# Patient Record
Sex: Female | Born: 1975
Health system: Southern US, Community
[De-identification: ages and names within clinical notes are randomized; demographics above are authoritative.]

## PROBLEM LIST (undated history)

## (undated) DIAGNOSIS — Z9289 Personal history of other medical treatment: Secondary | ICD-10-CM

## (undated) DIAGNOSIS — L309 Dermatitis, unspecified: Secondary | ICD-10-CM

## (undated) DIAGNOSIS — F419 Anxiety disorder, unspecified: Secondary | ICD-10-CM

## (undated) HISTORY — DX: Personal history of other medical treatment: Z92.89

---
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
## 2014-01-06 LAB — HM PAP SMEAR

## 2014-06-17 ENCOUNTER — Encounter: Payer: Self-pay | Admitting: Medical

## 2014-06-17 ENCOUNTER — Ambulatory Visit (INDEPENDENT_AMBULATORY_CARE_PROVIDER_SITE_OTHER): Payer: BC Managed Care – PPO | Admitting: Medical

## 2014-06-17 VITALS — BP 120/80 | HR 76 | Temp 98.1°F | Ht 68.0 in | Wt 154.8 lb

## 2014-06-17 DIAGNOSIS — Z0189 Encounter for other specified special examinations: Secondary | ICD-10-CM

## 2014-06-17 DIAGNOSIS — Z0000 Encounter for general adult medical examination without abnormal findings: Secondary | ICD-10-CM | POA: Insufficient documentation

## 2014-06-17 LAB — CBC WITH DIFFERENTIAL/PLATELET
BASOS PCT: 0.3 % (ref 0.0–3.0)
Basophils Absolute: 0 10*3/uL (ref 0.0–0.1)
EOS PCT: 0.4 % (ref 0.0–5.0)
Eosinophils Absolute: 0 10*3/uL (ref 0.0–0.7)
HEMATOCRIT: 41 % (ref 36.0–46.0)
HEMOGLOBIN: 13.4 g/dL (ref 12.0–15.0)
LYMPHS ABS: 1.3 10*3/uL (ref 0.7–4.0)
Lymphocytes Relative: 16 % (ref 12.0–46.0)
MCHC: 32.7 g/dL (ref 30.0–36.0)
MCV: 89.9 fl (ref 78.0–100.0)
MONO ABS: 0.4 10*3/uL (ref 0.1–1.0)
Monocytes Relative: 5.1 % (ref 3.0–12.0)
NEUTROS ABS: 6.6 10*3/uL (ref 1.4–7.7)
Neutrophils Relative %: 78.2 % — ABNORMAL HIGH (ref 43.0–77.0)
PLATELETS: 241 10*3/uL (ref 150.0–400.0)
RBC: 4.56 Mil/uL (ref 3.87–5.11)
RDW: 12.4 % (ref 11.5–15.5)
WBC: 8.4 10*3/uL (ref 4.0–10.5)

## 2014-06-17 LAB — COMPREHENSIVE METABOLIC PANEL
ALK PHOS: 39 U/L (ref 39–117)
ALT: 9 U/L (ref 0–35)
AST: 13 U/L (ref 0–37)
Albumin: 3.5 g/dL (ref 3.5–5.2)
BILIRUBIN TOTAL: 0.6 mg/dL (ref 0.2–1.2)
BUN: 12 mg/dL (ref 6–23)
CO2: 24 mEq/L (ref 19–32)
CREATININE: 0.7 mg/dL (ref 0.4–1.2)
Calcium: 8.7 mg/dL (ref 8.4–10.5)
Chloride: 104 mEq/L (ref 96–112)
GFR: 102.66 mL/min (ref >60.00)
Glucose, Bld: 84 mg/dL (ref 70–99)
Potassium: 4.7 mEq/L (ref 3.5–5.1)
Sodium: 132 mEq/L — ABNORMAL LOW (ref 135–145)
Total Protein: 6.7 g/dL (ref 6.0–8.3)

## 2014-06-17 LAB — TSH: TSH: 1.14 u[IU]/mL (ref 0.35–4.50)

## 2014-06-17 LAB — LIPID PANEL
CHOLESTEROL: 146 mg/dL (ref 0–200)
HDL: 41.7 mg/dL (ref >39.00)
LDL Cholesterol: 82 mg/dL (ref 0–99)
NonHDL: 104.3
Total CHOL/HDL Ratio: 4
Triglycerides: 113 mg/dL (ref 0.0–149.0)
VLDL: 22.6 mg/dL (ref 0.0–40.0)

## 2014-06-17 NOTE — Assessment & Plan Note (Signed)
Cbc, cmp, tsh, and lipid today. Fill out her form for wellness.

## 2014-06-17 NOTE — Progress Notes (Signed)
Pre visit review using our clinic review tool, if applicable. No additional management support is needed unless otherwise documented below in the visit note. 

## 2014-06-17 NOTE — Progress Notes (Signed)
Subjective:    Patient ID: Lydia Davis, female    DOB: 1975-07-30, 38 y.o.   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 Nonsmoker. 1 glass of wine on weekend approximate.  Pt had flu vaccine in October 2015.  Lmp- December 2nd, 2015.  Pt last pap July 2015 and normal.   No family history of breast cancer.  History of rash on and off etiology unkown. Allergy testing is negative. But better than in the past.   Past Medical History  Diagnosis Date  . History of TB skin testing     postive    History   Social History  . Marital Status: Married    Spouse Name: N/A    Number of Children: N/A  . Years of Education: N/A   Occupational History  . Registered Nurse [employer]    works in the [employer]  . Smoking status: Not on file  . Smokeless tobacco: Not on file  . Alcohol Use: 0.0 oz/week    0 Not specified per week     Comment: occasionally---glass of wine  . Drug Use: No  . Sexual Activity:    Partners: Male   Other Topics Concern  . Not on file   Social History Narrative  . No narrative on file    Past Surgical History  Procedure Laterality Date  . Cesarean section  2012  . Tonsillectomy  1994    Family History  Problem Relation Age of Onset  . Hyperlipidemia Paternal Grandfather   . Heart disease      both sets of grandparents  . Hypertension Father     and both sets of grandparents  . Diabetes Maternal Grandfather     Allergies not on file  No current outpatient prescriptions on file prior to visit.   No current facility-administered medications on file prior to visit.    BP 120/80 mmHg  Pulse 76  Temp(Src) 98.1 F (36.7 C) (Oral)  Wt 154 lb 12.8 oz (70.217 kg)  SpO2 98%  LMP 06/09/2014     Review of Systems  Constitutional: Negative for fever, chills and fatigue.  HENT:  Negative for congestion, ear discharge, ear pain, nosebleeds, postnasal drip, rhinorrhea, sinus pressure, sore throat and trouble swallowing.   Respiratory: Negative for cough, chest tightness, shortness of breath and wheezing.   Cardiovascular: Negative for chest pain and palpitations.  Gastrointestinal: Negative for nausea, vomiting, abdominal pain, diarrhea and constipation.  Genitourinary: Negative for dysuria and flank pain.  Musculoskeletal: Negative for back pain.  Skin: Positive for rash.       Recently improved.  Neurological: Negative for dizziness, tremors, seizures, syncope, weakness, light-headedness, numbness and headaches.  Hematological: Negative for adenopathy. Does not bruise/bleed easily.  Psychiatric/Behavioral: Negative for suicidal ideas, behavioral problems and dysphoric mood. The patient is not nervous/anxious.        Objective:   Physical Exam   General   Mental Status- Alert.  Orientation-Oriented x3. Build and Nutrition Well Nourished and Well Developed.  Skin General: Normal.  Color- Normal color. Moisture- Dry.Temperature warm. Lesions: No suspicious lesions  Head, Eyes, Ears, Nose, Thoat Ears-Normal. Auditory Canal-Bilateral-Normal. Tympanic Membrane- Bilateral-Normal. Eyes Fundi- Bilateral-Normal. Pupil- Bilateral- Direct reaction to light normal. Nose & Sinuses- Normal. Nostril- Bilateral-Normal.  Neck Neck- No Bruits or Masses. Thyroid-  Normal. No thyromegaly or nodules.  Skin- mild scattered faint rash on forearm. Rt posterior hand- mild dry thick rash at mcp joint area.  Chest and Lung Exam  Percussion: Quality and Intensity:-Percussion normal. Percussion of chest reveals- No Dullness. Palpation of the chest reveals- Non-tender. Auscultation: Breath sounds-Normal. Adventitious  Sounds:No adventitious   . Cardiovascular Inspection: No Heaves. Auscultation: Heart Sounds- Normal sinus rhythm without murmur or gallop, S1 WNL and S2  WNL.  Abdomen Inspection:- Inspection Normal. Inspection of abdomen reveals- No Hernias. Palpation/Percussion: Palpation and Percussion of the abdomen reveal- Non Tender and No Palpable masses. Liver: Other Characteristics- No Hepatmegaly Spleen:Other Characteristics- No Splenomegaly. Auscultation: Auscultation of the abdomen reveals-Bowel sounds normal and No Abdominal bruits.      Neurologic Mental Status- Normal Cranial Nerves- Normal Bilaterally, Motor- Normal. Strength:5/5 normal muscle strength- All Muscles. General Assessment of Reflexes- Right Knee- 2+. Left Knee- 2+. Coordination- Normal. Gait- Normal. Meningeal Signs- None.  Musculoskeletal Global Assessment General- Joints show full range of motion without obvious deformity and Normal muscle mass. Strength 5/5 in upper and lower extremities.  Lymphatic General lymphatics Description-No Generalized lymphadenopathy.            Assessment & Plan:

## 2014-06-17 NOTE — Patient Instructions (Addendum)
For your wellness exam, I ordered cbc, cmp, tsh and lipid panel.  I asked pt to find out for sure when last tetanus done. She states 8 yrs but our records indicate she may be due for one. If she needs let us know and we could give.  I filled out form and gave your original. Please make copy up front for our records.  Follow up as needed. Our group would be happy to see you for primary care needs.  Preventive Care for Adults A healthy lifestyle and preventive care can promote health and wellness. Preventive health guidelines for women include the following key practices.  A routine yearly physical is a good way to check with your health care provider about your health and preventive screening. It is a chance to share any concerns and updates on your health and to receive a thorough exam.  Visit your dentist for a routine exam and preventive care every 6 months. Brush your teeth twice a day and floss once a day. Good oral hygiene prevents tooth decay and gum disease.  The frequency of eye exams is based on your age, health, family medical history, use of contact lenses, and other factors. Follow your health care provider's recommendations for frequency of eye exams.  Eat a healthy diet. Foods like vegetables, fruits, whole grains, low-fat dairy products, and lean protein foods contain the nutrients you need without too many calories. Decrease your intake of foods high in solid fats, added sugars, and salt. Eat the right amount of calories for you.Get information about a proper diet from your health care provider, if necessary.  Regular physical exercise is one of the most important things you can do for your health. Most adults should get at least 150 minutes of moderate-intensity exercise (any activity that increases your heart rate and causes you to sweat) each week. In addition, most adults need muscle-strengthening exercises on 2 or more days a week.  Maintain a healthy weight. The body mass  index (BMI) is a screening tool to identify possible weight problems. It provides an estimate of body fat based on height and weight. Your health care provider can find your BMI and can help you achieve or maintain a healthy weight.For adults 20 years and older:  A BMI below 18.5 is considered underweight.  A BMI of 18.5 to 24.9 is normal.  A BMI of 25 to 29.9 is considered overweight.  A BMI of 30 and above is considered obese.  Maintain normal blood lipids and cholesterol levels by exercising and minimizing your intake of saturated fat. Eat a balanced diet with plenty of fruit and vegetables. Blood tests for lipids and cholesterol should begin at age 76 and be repeated every 5 years. If your lipid or cholesterol levels are high, you are over 50, or you are at high risk for heart disease, you may need your cholesterol levels checked more frequently.Ongoing high lipid and cholesterol levels should be treated with medicines if diet and exercise are not working.  If you smoke, find out from your health care provider how to quit. If you do not use tobacco, do not start.  Lung cancer screening is recommended for adults aged 48-80 years who are at high risk for developing lung cancer because of a history of smoking. A yearly low-dose CT scan of the lungs is recommended for people who have at least a 30-pack-year history of smoking and are a current smoker or have quit within the past 15 years. A  pack year of smoking is smoking an average of 1 pack of cigarettes a day for 1 year (for example: 1 pack a day for 30 years or 2 packs a day for 15 years). Yearly screening should continue until the smoker has stopped smoking for at least 15 years. Yearly screening should be stopped for people who develop a health problem that would prevent them from having lung cancer treatment.  If you are pregnant, do not drink alcohol. If you are breastfeeding, be very cautious about drinking alcohol. If you are not pregnant  and choose to drink alcohol, do not have more than 1 drink per day. One drink is considered to be 12 ounces (355 mL) of beer, 5 ounces (148 mL) of wine, or 1.5 ounces (44 mL) of liquor.  Avoid use of street drugs. Do not share needles with anyone. Ask for help if you need support or instructions about stopping the use of drugs.  High blood pressure causes heart disease and increases the risk of stroke. Your blood pressure should be checked at least every 1 to 2 years. Ongoing high blood pressure should be treated with medicines if weight loss and exercise do not work.  If you are 12-69 years old, ask your health care provider if you should take aspirin to prevent strokes.  Diabetes screening involves taking a blood sample to check your fasting blood sugar level. This should be done once every 3 years, after age 87, if you are within normal weight and without risk factors for diabetes. Testing should be considered at a younger age or be carried out more frequently if you are overweight and have at least 1 risk factor for diabetes.  Breast cancer screening is essential preventive care for women. You should practice "breast self-awareness." This means understanding the normal appearance and feel of your breasts and may include breast self-examination. Any changes detected, no matter how small, should be reported to a health care provider. Women in their 9s and 30s should have a clinical breast exam (CBE) by a health care provider as part of a regular health exam every 1 to 3 years. After age 36, women should have a CBE every year. Starting at age 33, women should consider having a mammogram (breast X-ray test) every year. Women who have a family history of breast cancer should talk to their health care provider about genetic screening. Women at a high risk of breast cancer should talk to their health care providers about having an MRI and a mammogram every year.  Breast cancer gene (BRCA)-related cancer  risk assessment is recommended for women who have family members with BRCA-related cancers. BRCA-related cancers include breast, ovarian, tubal, and peritoneal cancers. Having family members with these cancers may be associated with an increased risk for harmful changes (mutations) in the breast cancer genes BRCA1 and BRCA2. Results of the assessment will determine the need for genetic counseling and BRCA1 and BRCA2 testing.  Routine pelvic exams to screen for cancer are no longer recommended for nonpregnant women who are considered low risk for cancer of the pelvic organs (ovaries, uterus, and vagina) and who do not have symptoms. Ask your health care provider if a screening pelvic exam is right for you.  If you have had past treatment for cervical cancer or a condition that could lead to cancer, you need Pap tests and screening for cancer for at least 20 years after your treatment. If Pap tests have been discontinued, your risk factors (such as having  a new sexual partner) need to be reassessed to determine if screening should be resumed. Some women have medical problems that increase the chance of getting cervical cancer. In these cases, your health care provider may recommend more frequent screening and Pap tests.  The HPV test is an additional test that may be used for cervical cancer screening. The HPV test looks for the virus that can cause the cell changes on the cervix. The cells collected during the Pap test can be tested for HPV. The HPV test could be used to screen women aged 26 years and older, and should be used in women of any age who have unclear Pap test results. After the age of 55, women should have HPV testing at the same frequency as a Pap test.  Colorectal cancer can be detected and often prevented. Most routine colorectal cancer screening begins at the age of 63 years and continues through age 27 years. However, your health care provider may recommend screening at an earlier age if you  have risk factors for colon cancer. On a yearly basis, your health care provider may provide home test kits to check for hidden blood in the stool. Use of a small camera at the end of a tube, to directly examine the colon (sigmoidoscopy or colonoscopy), can detect the earliest forms of colorectal cancer. Talk to your health care provider about this at age 17, when routine screening begins. Direct exam of the colon should be repeated every 5-10 years through age 55 years, unless early forms of pre-cancerous polyps or small growths are found.  People who are at an increased risk for hepatitis B should be screened for this virus. You are considered at high risk for hepatitis B if:  You were born in a country where hepatitis B occurs often. Talk with your health care provider about which countries are considered high risk.  Your parents were born in a high-risk country and you have not received a shot to protect against hepatitis B (hepatitis B vaccine).  You have HIV or AIDS.  You use needles to inject street drugs.  You live with, or have sex with, someone who has hepatitis B.  You get hemodialysis treatment.  You take certain medicines for conditions like cancer, organ transplantation, and autoimmune conditions.  Hepatitis C blood testing is recommended for all people born from [birth year] through 1965 and any individual with known risks for hepatitis C.  Practice safe sex. Use condoms and avoid high-risk sexual practices to reduce the spread of sexually transmitted infections (STIs). STIs include gonorrhea, chlamydia, syphilis, trichomonas, herpes, HPV, and human immunodeficiency virus (HIV). Herpes, HIV, and HPV are viral illnesses that have no cure. They can result in disability, cancer, and death.  You should be screened for sexually transmitted illnesses (STIs) including gonorrhea and chlamydia if:  You are sexually active and are younger than 24 years.  You are older than 24 years and your  health care provider tells you that you are at risk for this type of infection.  Your sexual activity has changed since you were last screened and you are at an increased risk for chlamydia or gonorrhea. Ask your health care provider if you are at risk.  If you are at risk of being infected with HIV, it is recommended that you take a prescription medicine daily to prevent HIV infection. This is called preexposure prophylaxis (PrEP). You are considered at risk if:  You are a heterosexual woman, are sexually active, and are  at increased risk for HIV infection.  You take drugs by injection.  You are sexually active with a partner who has HIV.  Talk with your health care provider about whether you are at high risk of being infected with HIV. If you choose to begin PrEP, you should first be tested for HIV. You should then be tested every 3 months for as long as you are taking PrEP.  Osteoporosis is a disease in which the bones lose minerals and strength with aging. This can result in serious bone fractures or breaks. The risk of osteoporosis can be identified using a bone density scan. Women ages 104 years and over and women at risk for fractures or osteoporosis should discuss screening with their health care providers. Ask your health care provider whether you should take a calcium supplement or vitamin D to reduce the rate of osteoporosis.  Menopause can be associated with physical symptoms and risks. Hormone replacement therapy is available to decrease symptoms and risks. You should talk to your health care provider about whether hormone replacement therapy is right for you.  Use sunscreen. Apply sunscreen liberally and repeatedly throughout the day. You should seek shade when your shadow is shorter than you. Protect yourself by wearing long sleeves, pants, a wide-brimmed hat, and sunglasses year round, whenever you are outdoors.  Once a month, do a whole body skin exam, using a mirror to look at  the skin on your back. Tell your health care provider of new moles, moles that have irregular borders, moles that are larger than a pencil eraser, or moles that have changed in shape or color.  Stay current with required vaccines (immunizations).  Influenza vaccine. All adults should be immunized every year.  Tetanus, diphtheria, and acellular pertussis (Td, Tdap) vaccine. Pregnant women should receive 1 dose of Tdap vaccine during each pregnancy. The dose should be obtained regardless of the length of time since the last dose. Immunization is preferred during the 27th-36th week of gestation. An adult who has not previously received Tdap or who does not know her vaccine status should receive 1 dose of Tdap. This initial dose should be followed by tetanus and diphtheria toxoids (Td) booster doses every 10 years. Adults with an unknown or incomplete history of completing a 3-dose immunization series with Td-containing vaccines should begin or complete a primary immunization series including a Tdap dose. Adults should receive a Td booster every 10 years.  Varicella vaccine. An adult without evidence of immunity to varicella should receive 2 doses or a second dose if she has previously received 1 dose. Pregnant females who do not have evidence of immunity should receive the first dose after pregnancy. This first dose should be obtained before leaving the health care facility. The second dose should be obtained 4-8 weeks after the first dose.  Human papillomavirus (HPV) vaccine. Females aged 13-26 years who have not received the vaccine previously should obtain the 3-dose series. The vaccine is not recommended for use in pregnant females. However, pregnancy testing is not needed before receiving a dose. If a female is found to be pregnant after receiving a dose, no treatment is needed. In that case, the remaining doses should be delayed until after the pregnancy. Immunization is recommended for any person with  an immunocompromised condition through the age of 66 years if she did not get any or all doses earlier. During the 3-dose series, the second dose should be obtained 4-8 weeks after the first dose. The third dose should  be obtained 24 weeks after the first dose and 16 weeks after the second dose.  Zoster vaccine. One dose is recommended for adults aged 39 years or older unless certain conditions are present.  Measles, mumps, and rubella (MMR) vaccine. Adults born before [birth year] generally are considered immune to measles and mumps. Adults born in [birth year] or later should have 1 or more doses of MMR vaccine unless there is a contraindication to the vaccine or there is laboratory evidence of immunity to each of the three diseases. A routine second dose of MMR vaccine should be obtained at least 28 days after the first dose for students attending postsecondary schools, health care workers, or international travelers. People who received inactivated measles vaccine or an unknown type of measles vaccine during 1963-1967 should receive 2 doses of MMR vaccine. People who received inactivated mumps vaccine or an unknown type of mumps vaccine before 1979 and are at high risk for mumps infection should consider immunization with 2 doses of MMR vaccine. For females of childbearing age, rubella immunity should be determined. If there is no evidence of immunity, females who are not pregnant should be vaccinated. If there is no evidence of immunity, females who are pregnant should delay immunization until after pregnancy. Unvaccinated health care workers born before [birth year] who lack laboratory evidence of measles, mumps, or rubella immunity or laboratory confirmation of disease should consider measles and mumps immunization with 2 doses of MMR vaccine or rubella immunization with 1 dose of MMR vaccine.  Pneumococcal 13-valent conjugate (PCV13) vaccine. When indicated, a person who is uncertain of her immunization history and has no  record of immunization should receive the PCV13 vaccine. An adult aged 62 years or older who has certain medical conditions and has not been previously immunized should receive 1 dose of PCV13 vaccine. This PCV13 should be followed with a dose of pneumococcal polysaccharide (PPSV23) vaccine. The PPSV23 vaccine dose should be obtained at least 8 weeks after the dose of PCV13 vaccine. An adult aged 33 years or older who has certain medical conditions and previously received 1 or more doses of PPSV23 vaccine should receive 1 dose of PCV13. The PCV13 vaccine dose should be obtained 1 or more years after the last PPSV23 vaccine dose.  Pneumococcal polysaccharide (PPSV23) vaccine. When PCV13 is also indicated, PCV13 should be obtained first. All adults aged 24 years and older should be immunized. An adult younger than age 63 years who has certain medical conditions should be immunized. Any person who resides in a nursing home or long-term care facility should be immunized. An adult smoker should be immunized. People with an immunocompromised condition and certain other conditions should receive both PCV13 and PPSV23 vaccines. People with human immunodeficiency virus (HIV) infection should be immunized as soon as possible after diagnosis. Immunization during chemotherapy or radiation therapy should be avoided. Routine use of PPSV23 vaccine is not recommended for American Indians, [place] Natives, or people younger than 65 years unless there are medical conditions that require PPSV23 vaccine. When indicated, people who have unknown immunization and have no record of immunization should receive PPSV23 vaccine. One-time revaccination 5 years after the first dose of PPSV23 is recommended for people aged 19-64 years who have chronic kidney failure, nephrotic syndrome, asplenia, or immunocompromised conditions. People who received 1-2 doses of PPSV23 before age 60 years should receive another dose of PPSV23 vaccine at age 3  years or later if at least 5 years have passed since the previous dose. Doses of PPSV23  are not needed for people immunized with PPSV23 at or after age 61 years.  Meningococcal vaccine. Adults with asplenia or persistent complement component deficiencies should receive 2 doses of quadrivalent meningococcal conjugate (MenACWY-D) vaccine. The doses should be obtained at least 2 months apart. Microbiologists working with certain meningococcal bacteria, [place] recruits, people at risk during an outbreak, and people who travel to or live in countries with a high rate of meningitis should be immunized. A first-year college student up through age 83 years who is living in a residence hall should receive a dose if she did not receive a dose on or after her 16th birthday. Adults who have certain high-risk conditions should receive one or more doses of vaccine.  Hepatitis A vaccine. Adults who wish to be protected from this disease, have certain high-risk conditions, work with hepatitis A-infected animals, work in hepatitis A research labs, or travel to or work in countries with a high rate of hepatitis A should be immunized. Adults who were previously unvaccinated and who anticipate close contact with an international adoptee during the first 60 days after arrival in the [place] from a country with a high rate of hepatitis A should be immunized.  Hepatitis B vaccine. Adults who wish to be protected from this disease, have certain high-risk conditions, may be exposed to blood or other infectious body fluids, are household contacts or sex partners of hepatitis B positive people, are clients or workers in certain care facilities, or travel to or work in countries with a high rate of hepatitis B should be immunized.  Haemophilus influenzae type b (Hib) vaccine. A previously unvaccinated person with asplenia or sickle cell disease or having a scheduled splenectomy should receive 1 dose of Hib vaccine. Regardless  of previous immunization, a recipient of a hematopoietic stem cell transplant should receive a 3-dose series 6-12 months after her successful transplant. Hib vaccine is not recommended for adults with HIV infection. Preventive Services / Frequency Ages 62 to 2 years  Blood pressure check.** / Every 1 to 2 years.  Lipid and cholesterol check.** / Every 5 years beginning at age 64.  Clinical breast exam.** / Every 3 years for women in their 36s and 45s.  BRCA-related cancer risk assessment.** / For women who have family members with a BRCA-related cancer (breast, ovarian, tubal, or peritoneal cancers).  Pap test.** / Every 2 years from ages 56 through 65. Every 3 years starting at age 25 through age 23 or 49 with a history of 3 consecutive normal Pap tests.  HPV screening.** / Every 3 years from ages 77 through ages 68 to 41 with a history of 3 consecutive normal Pap tests.  Hepatitis C blood test.** / For any individual with known risks for hepatitis C.  Skin self-exam. / Monthly.  Influenza vaccine. / Every year.  Tetanus, diphtheria, and acellular pertussis (Tdap, Td) vaccine.** / Consult your health care provider. Pregnant women should receive 1 dose of Tdap vaccine during each pregnancy. 1 dose of Td every 10 years.  Varicella vaccine.** / Consult your health care provider. Pregnant females who do not have evidence of immunity should receive the first dose after pregnancy.  HPV vaccine. / 3 doses over 6 months, if 56 and younger. The vaccine is not recommended for use in pregnant females. However, pregnancy testing is not needed before receiving a dose.  Measles, mumps, rubella (MMR) vaccine.** / You need at least 1 dose of MMR if you were born in 1957 or later.  You may also need a 2nd dose. For females of childbearing age, rubella immunity should be determined. If there is no evidence of immunity, females who are not pregnant should be vaccinated. If there is no evidence of immunity,  females who are pregnant should delay immunization until after pregnancy.  Pneumococcal 13-valent conjugate (PCV13) vaccine.** / Consult your health care provider.  Pneumococcal polysaccharide (PPSV23) vaccine.** / 1 to 2 doses if you smoke cigarettes or if you have certain conditions.  Meningococcal vaccine.** / 1 dose if you are age 46 to 47 years and a [employer] researcher living in a residence hall, or have one of several medical conditions, you need to get vaccinated against meningococcal disease. You may also need additional booster doses.  Hepatitis A vaccine.** / Consult your health care provider.  Hepatitis B vaccine.** / Consult your health care provider.  Haemophilus influenzae type b (Hib) vaccine.** / Consult your health care provider. Ages 51 to 70 years  Blood pressure check.** / Every 1 to 2 years.  Lipid and cholesterol check.** / Every 5 years beginning at age 71 years.  Lung cancer screening. / Every year if you are aged 76-80 years and have a 30-pack-year history of smoking and currently smoke or have quit within the past 15 years. Yearly screening is stopped once you have quit smoking for at least 15 years or develop a health problem that would prevent you from having lung cancer treatment.  Clinical breast exam.** / Every year after age 59 years.  BRCA-related cancer risk assessment.** / For women who have family members with a BRCA-related cancer (breast, ovarian, tubal, or peritoneal cancers).  Mammogram.** / Every year beginning at age 5 years and continuing for as long as you are in good health. Consult with your health care provider.  Pap test.** / Every 3 years starting at age 63 years through age 2 or 29 years with a history of 3 consecutive normal Pap tests.  HPV screening.** / Every 3 years from ages 68 years through ages 80 to 59 years with a history of 3 consecutive normal Pap tests.  Fecal occult blood test (FOBT) of stool. / Every year  beginning at age 28 years and continuing until age 80 years. You may not need to do this test if you get a colonoscopy every 10 years.  Flexible sigmoidoscopy or colonoscopy.** / Every 5 years for a flexible sigmoidoscopy or every 10 years for a colonoscopy beginning at age 17 years and continuing until age 18 years.  Hepatitis C blood test.** / For all people born from [birth year] through 1965 and any individual with known risks for hepatitis C.  Skin self-exam. / Monthly.  Influenza vaccine. / Every year.  Tetanus, diphtheria, and acellular pertussis (Tdap/Td) vaccine.** / Consult your health care provider. Pregnant women should receive 1 dose of Tdap vaccine during each pregnancy. 1 dose of Td every 10 years.  Varicella vaccine.** / Consult your health care provider. Pregnant females who do not have evidence of immunity should receive the first dose after pregnancy.  Zoster vaccine.** / 1 dose for adults aged 27 years or older.  Measles, mumps, rubella (MMR) vaccine.** / You need at least 1 dose of MMR if you were born in 1957 or later. You may also need a 2nd dose. For females of childbearing age, rubella immunity should be determined. If there is no evidence of immunity, females who are not pregnant should be vaccinated. If there is no evidence of immunity, females  who are pregnant should delay immunization until after pregnancy.  Pneumococcal 13-valent conjugate (PCV13) vaccine.** / Consult your health care provider.  Pneumococcal polysaccharide (PPSV23) vaccine.** / 1 to 2 doses if you smoke cigarettes or if you have certain conditions.  Meningococcal vaccine.** / Consult your health care provider.  Hepatitis A vaccine.** / Consult your health care provider.  Hepatitis B vaccine.** / Consult your health care provider.  Haemophilus influenzae type b (Hib) vaccine.** / Consult your health care provider. Ages 51 years and over  Blood pressure check.** / Every 1 to 2 years.  Lipid and  cholesterol check.** / Every 5 years beginning at age 7 years.  Lung cancer screening. / Every year if you are aged 53-80 years and have a 30-pack-year history of smoking and currently smoke or have quit within the past 15 years. Yearly screening is stopped once you have quit smoking for at least 15 years or develop a health problem that would prevent you from having lung cancer treatment.  Clinical breast exam.** / Every year after age 62 years.  BRCA-related cancer risk assessment.** / For women who have family members with a BRCA-related cancer (breast, ovarian, tubal, or peritoneal cancers).  Mammogram.** / Every year beginning at age 23 years and continuing for as long as you are in good health. Consult with your health care provider.  Pap test.** / Every 3 years starting at age 34 years through age 71 or 82 years with 3 consecutive normal Pap tests. Testing can be stopped between 65 and 70 years with 3 consecutive normal Pap tests and no abnormal Pap or HPV tests in the past 10 years.  HPV screening.** / Every 3 years from ages 28 years through ages 25 or 65 years with a history of 3 consecutive normal Pap tests. Testing can be stopped between 65 and 70 years with 3 consecutive normal Pap tests and no abnormal Pap or HPV tests in the past 10 years.  Fecal occult blood test (FOBT) of stool. / Every year beginning at age 53 years and continuing until age 77 years. You may not need to do this test if you get a colonoscopy every 10 years.  Flexible sigmoidoscopy or colonoscopy.** / Every 5 years for a flexible sigmoidoscopy or every 10 years for a colonoscopy beginning at age 40 years and continuing until age 36 years.  Hepatitis C blood test.** / For all people born from [birth year] through 1965 and any individual with known risks for hepatitis C.  Osteoporosis screening.** / A one-time screening for women ages 64 years and over and women at risk for fractures or osteoporosis.  Skin self-exam. /  Monthly.  Influenza vaccine. / Every year.  Tetanus, diphtheria, and acellular pertussis (Tdap/Td) vaccine.** / 1 dose of Td every 10 years.  Varicella vaccine.** / Consult your health care provider.  Zoster vaccine.** / 1 dose for adults aged 49 years or older.  Pneumococcal 13-valent conjugate (PCV13) vaccine.** / Consult your health care provider.  Pneumococcal polysaccharide (PPSV23) vaccine.** / 1 dose for all adults aged 79 years and older.  Meningococcal vaccine.** / Consult your health care provider.  Hepatitis A vaccine.** / Consult your health care provider.  Hepatitis B vaccine.** / Consult your health care provider.  Haemophilus influenzae type b (Hib) vaccine.** / Consult your health care provider. ** Family history and personal history of risk and conditions may change your health care provider's recommendations. Document Released: 08/21/2001 Document Revised: 11/09/2013 Document Reviewed: 11/20/2010 [employer] Patient Information 2015  [employer]. This information is not intended to replace advice given to you by your health care provider. Make sure you discuss any questions you have with your health care provider.

## 2015-01-04 ENCOUNTER — Telehealth: Payer: Self-pay | Admitting: Medical

## 2015-01-04 NOTE — Telephone Encounter (Signed)
Fine with me

## 2015-01-04 NOTE — Telephone Encounter (Signed)
No problem.

## 2015-01-04 NOTE — Telephone Encounter (Signed)
Pt would to transfer care from [facility]. Dr. Etter Sjogren please advise

## 2015-01-31 ENCOUNTER — Telehealth: Payer: Self-pay | Admitting: Behavioral Health

## 2015-01-31 NOTE — Telephone Encounter (Signed)
Unable to reach patient at time of Pre-Visit Call.  Left message for patient to return call when available.    

## 2015-02-01 ENCOUNTER — Ambulatory Visit (INDEPENDENT_AMBULATORY_CARE_PROVIDER_SITE_OTHER): Payer: BLUE CROSS/BLUE SHIELD | Admitting: Family Medicine

## 2015-02-01 ENCOUNTER — Encounter: Payer: Self-pay | Admitting: Family Medicine

## 2015-02-01 VITALS — BP 110/74 | HR 83 | Temp 98.4°F | Ht 68.0 in | Wt 151.4 lb

## 2015-02-01 DIAGNOSIS — E049 Nontoxic goiter, unspecified: Secondary | ICD-10-CM | POA: Diagnosis not present

## 2015-02-01 NOTE — Patient Instructions (Signed)
Goiter Goiter is an enlarged thyroid gland. The thyroid gland sits at the base of the front of the neck. The gland produces hormones that regulate mood, body temperature, pulse rate, and digestion. Most goiters are painless and are not a cause for serious concern. Goiters and conditions that cause goiters can be treated if necessary.  CAUSES  Common causes of goiter include:  Graves disease (causes too much hormone to be produced [hyperthyroidism]).  Hashimoto disease (causes too little hormone to be produced [hypothyroidism]).  Thyroiditis (inflammation of the thyroid sometimes caused by virus or pregnancy).  Nodular goiter (small bumps form; sometimes called toxic nodular goiter).  Pregnancy.  Thyroid cancer (very few goiters with nodules are cancerous).  Certain medications.  Radiation exposure.  Iodine deficiency (more common in developing countries in inland populations). RISK FACTORS Risk factors for goiter include:  A family history of goiter.  Female gender.  Inadequate iodine in the diet.  Age older than 25 years. SYMPTOMS  Many goiters do not cause symptoms. When symptoms do occur, they may include:  Swelling in the lower part of the neck. This swelling can range from a very small bump to a large lump.  A tight feeling in the throat.  A hoarse voice. Less commonly, a goiter may result in:  Coughing.  Wheezing.  Difficulty swallowing.  Difficulty breathing.  Bulging neck veins.  Dizziness. When a goiter is the result of hyperthyroidism, symptoms may include:  Rapid or irregular heartbeat.  Sickness in your stomach (nausea).  Vomiting.  Diarrhea.  Shaking.  Irritable feeling.  Bulging eyes.  Weight loss.  Heat sensitivity.  Anxiety. When a goiter is the result of hypothyroidism, symptoms may include:  Tiredness.  Dry skin.  Constipation.  Weight gain.  Irregular menstrual cycle.  Depressed mood.  Sensitivity to  cold. DIAGNOSIS  Tests used to diagnose goiter include:  A physical exam.  Blood tests, including thyroid hormone levels and antibody testing.  Ultrasonography, computerized X-ray scan (computed tomography, CT) or computerized magnetic scan (magnetic resonance imaging, MRI).  Thyroid scan (imaging along with safe radioactive injection).  Tissue sample taken (biopsy) of nodules. This is sometimes done to confirm that the nodules are not cancerous. TREATMENT  Treatment will depend on the cause of the goiter. Treatment may include:  Monitoring. In some cases, no treatment is necessary, and your doctor will monitor your condition at regular checkups.  Medications and supplements. Thyroid medication (thyroid hormone replacement) is available for hyperthyroidism and hypothyroidism.  If inflammation is the cause, over-the-counter medication or steroid medication may be recommended.  Goiters caused by iodine deficiency can be treated with iodine supplements or changes in diet.  Radioactive iodine treatment. Radioactive iodine is injected into the blood. It travels to the thyroid gland, kills thyroid cells, and reduces the size of the gland. This is only used when the thyroid gland is overactive. Lifelong thyroid hormone medication is often necessary after this treatment.  Surgery. A procedure to remove all or part of the gland may be recommended in severe cases or when cancer is the cause. Hormones can be taken to replace the hormones normally produced by the thyroid. HOME CARE INSTRUCTIONS   Take medications as directed.  Follow your caregiver's recommendations for any dietary changes.  Follow up with your caregiver for further examination and testing, as directed. PREVENTION   If you have a family history of goiter, discuss screening with your doctor.  Make sure you are getting enough iodine in your diet.  Use  of iodized table salt can help prevent iodine deficiency. Document  Released: 12/13/2009 Document Revised: 11/09/2013 Document Reviewed: 12/13/2009 [facility] Patient Information 2015 [place], [place]. This information is not intended to replace advice given to you by your health care provider. Make sure you discuss any questions you have with your health care provider.

## 2015-02-01 NOTE — Progress Notes (Signed)
Subjective:    Patient ID: Lydia Davis, female    DOB: Nov 28, 1975, 39 y.o.   MRN: [id number]  HPI  Patient here to discuss her dentist appointment.  Her dentist told her her thyroid was enlarged.  She has no other thyroid symptoms except dry hair.    Past Medical History  Diagnosis Date  . History of TB skin testing     postive    Review of Systems  Constitutional: Negative for diaphoresis, appetite change, fatigue and unexpected weight change.  HENT: Negative for congestion and trouble swallowing.   Eyes: Negative for pain, redness and visual disturbance.  Respiratory: Negative for cough, chest tightness, shortness of breath and wheezing.   Cardiovascular: Negative for chest pain, palpitations and leg swelling.  Gastrointestinal: Negative for nausea, vomiting, diarrhea and constipation.  Endocrine: Negative for cold intolerance, heat intolerance, polydipsia, polyphagia and polyuria.  Genitourinary: Negative for dysuria, frequency and difficulty urinating.  Neurological: Negative for dizziness, light-headedness, numbness and headaches.  Psychiatric/Behavioral: Negative for dysphoric mood and decreased concentration. The patient is not nervous/anxious.     Current Outpatient Prescriptions on File Prior to Visit  Medication Sig Dispense Refill  . LOMEDIA 24 FE 1-20 MG-MCG(24) tablet   10   No current facility-administered medications on file prior to visit.       Objective:    Physical Exam  Constitutional: She is oriented to person, place, and time. She appears well-developed and well-nourished.  HENT:  Head: Normocephalic and atraumatic.  Eyes: Conjunctivae and EOM are normal.  Neck: Normal range of motion. Neck supple. No JVD present. Carotid bruit is not present. Thyromegaly present. No thyroid mass present.  Cardiovascular: Normal rate, regular rhythm and normal heart sounds.   No murmur heard. Pulmonary/Chest: Effort normal and breath sounds normal. No respiratory  distress. She has no wheezes. She has no rales. She exhibits no tenderness.  Musculoskeletal: She exhibits no edema.  Neurological: She is alert and oriented to person, place, and time.  Psychiatric: She has a normal mood and affect.    BP 110/74 mmHg  Pulse 83  Temp(Src) 98.4 F (36.9 C) (Oral)  Ht 5\' 8"  (1.727 m)  Wt 151 lb 6.4 oz (68.675 kg)  BMI 23.03 kg/m2  SpO2 99%  LMP 01/14/2015 (Exact Date) Wt Readings from Last 3 Encounters:  02/01/15 151 lb 6.4 oz (68.675 kg)  06/17/14 154 lb 12.8 oz (70.217 kg)     Lab Results  Component Value Date   WBC 8.4 06/17/2014   HGB 13.4 06/17/2014   HCT 41.0 06/17/2014   PLT 241.0 06/17/2014   GLUCOSE 84 06/17/2014   CHOL 146 06/17/2014   TRIG 113.0 06/17/2014   HDL 41.70 06/17/2014   LDLCALC 82 06/17/2014   ALT 9 06/17/2014   AST 13 06/17/2014   NA 132* 06/17/2014   K 4.7 06/17/2014   CL 104 06/17/2014   CREATININE 0.7 06/17/2014   BUN 12 06/17/2014   CO2 24 06/17/2014   TSH 1.14 06/17/2014       Assessment & Plan:   Problem List Items Addressed This Visit    None    Visit Diagnoses    Goiter    -  Primary    Relevant Orders    Thyroid Panel With TSH    US Soft Tissue Head/Neck       I am having Ms. Tutterow maintain her LOMEDIA 24 FE.  No orders of the defined types were placed in this encounter.  Garnet Koyanagi, DO

## 2015-02-01 NOTE — Progress Notes (Signed)
Pre visit review using our clinic review tool, if applicable. No additional management support is needed unless otherwise documented below in the visit note. 

## 2015-02-02 LAB — THYROID PANEL WITH TSH
FREE THYROXINE INDEX: 2.6 (ref 1.4–3.8)
T3 Uptake: 24 % (ref 22–35)
T4, Total: 10.9 ug/dL (ref 4.5–12.0)
TSH: 2.274 u[IU]/mL (ref 0.350–4.500)

## 2015-02-03 ENCOUNTER — Ambulatory Visit (HOSPITAL_BASED_OUTPATIENT_CLINIC_OR_DEPARTMENT_OTHER)
Admission: RE | Admit: 2015-02-03 | Discharge: 2015-02-03 | Disposition: A | Discharge status: Home / Self Care | Payer: BLUE CROSS/BLUE SHIELD | Source: Ambulatory Visit | Attending: Family Medicine | Admitting: Family Medicine

## 2015-02-03 DIAGNOSIS — E049 Nontoxic goiter, unspecified: Secondary | ICD-10-CM

## 2015-02-03 DIAGNOSIS — E010 Iodine-deficiency related diffuse (endemic) goiter: Secondary | ICD-10-CM | POA: Diagnosis not present

## 2015-02-09 DIAGNOSIS — L209 Atopic dermatitis, unspecified: Secondary | ICD-10-CM | POA: Insufficient documentation

## 2015-06-20 ENCOUNTER — Encounter: Payer: BLUE CROSS/BLUE SHIELD | Admitting: Medical

## 2015-10-24 ENCOUNTER — Ambulatory Visit (INDEPENDENT_AMBULATORY_CARE_PROVIDER_SITE_OTHER): Payer: BLUE CROSS/BLUE SHIELD | Admitting: Family Medicine

## 2015-10-24 ENCOUNTER — Encounter: Payer: Self-pay | Admitting: Family Medicine

## 2015-10-24 VITALS — BP 117/80 | HR 74 | Temp 98.1°F | Ht 68.0 in | Wt 156.4 lb

## 2015-10-24 DIAGNOSIS — Z0000 Encounter for general adult medical examination without abnormal findings: Secondary | ICD-10-CM

## 2015-10-24 DIAGNOSIS — Z309 Encounter for contraceptive management, unspecified: Secondary | ICD-10-CM

## 2015-10-24 LAB — POCT URINALYSIS DIPSTICK
BILIRUBIN UA: NEGATIVE
Blood, UA: NEGATIVE
GLUCOSE UA: NEGATIVE
KETONES UA: NEGATIVE
LEUKOCYTES UA: NEGATIVE
Nitrite, UA: NEGATIVE
PROTEIN UA: NEGATIVE
Urobilinogen, UA: 0.2
pH, UA: 6

## 2015-10-24 LAB — CBC WITH DIFFERENTIAL/PLATELET
BASOS ABS: 0 10*3/uL (ref 0.0–0.1)
Basophils Relative: 0.4 % (ref 0.0–3.0)
EOS PCT: 0.6 % (ref 0.0–5.0)
Eosinophils Absolute: 0 10*3/uL (ref 0.0–0.7)
HCT: 41.1 % (ref 36.0–46.0)
HEMOGLOBIN: 14 g/dL (ref 12.0–15.0)
LYMPHS ABS: 1.3 10*3/uL (ref 0.7–4.0)
Lymphocytes Relative: 27.2 % (ref 12.0–46.0)
MCHC: 34 g/dL (ref 30.0–36.0)
MCV: 87.7 fl (ref 78.0–100.0)
MONO ABS: 0.3 10*3/uL (ref 0.1–1.0)
MONOS PCT: 6.2 % (ref 3.0–12.0)
NEUTROS PCT: 65.6 % (ref 43.0–77.0)
Neutro Abs: 3.2 10*3/uL (ref 1.4–7.7)
Platelets: 272 10*3/uL (ref 150.0–400.0)
RBC: 4.68 Mil/uL (ref 3.87–5.11)
RDW: 12 % (ref 11.5–15.5)
WBC: 4.9 10*3/uL (ref 4.0–10.5)

## 2015-10-24 LAB — TSH: TSH: 1.15 u[IU]/mL (ref 0.35–4.50)

## 2015-10-24 LAB — LIPID PANEL
CHOLESTEROL: 176 mg/dL (ref 0–200)
HDL: 45.3 mg/dL (ref >39.00)
LDL CALC: 111 mg/dL — AB (ref 0–99)
NonHDL: 130.6
Total CHOL/HDL Ratio: 4
Triglycerides: 100 mg/dL (ref 0.0–149.0)
VLDL: 20 mg/dL (ref 0.0–40.0)

## 2015-10-24 LAB — VITAMIN B12: Vitamin B-12: 190 pg/mL — ABNORMAL LOW (ref 211–911)

## 2015-10-24 MED ORDER — NORETHIN ACE-ETH ESTRAD-FE 1-20 MG-MCG(24) PO TABS: 1.0000 | ORAL_TABLET | Freq: Every day | ORAL | Status: DC

## 2015-10-24 NOTE — Patient Instructions (Addendum)
Preventive Care for Adults, Female A healthy lifestyle and preventive care can promote health and wellness. Preventive health guidelines for women include the following key practices.  A routine yearly physical is a good way to check with your health care provider about your health and preventive screening. It is a chance to share any concerns and updates on your health and to receive a thorough exam.  Visit your dentist for a routine exam and preventive care every 6 months. Brush your teeth twice a day and floss once a day. Good oral hygiene prevents tooth decay and gum disease.  The frequency of eye exams is based on your age, health, family medical history, use of contact lenses, and other factors. Follow your health care provider's recommendations for frequency of eye exams.  Eat a healthy diet. Foods like vegetables, fruits, whole grains, low-fat dairy products, and lean protein foods contain the nutrients you need without too many calories. Decrease your intake of foods high in solid fats, added sugars, and salt. Eat the right amount of calories for you.Get information about a proper diet from your health care provider, if necessary.  Regular physical exercise is one of the most important things you can do for your health. Most adults should get at least 150 minutes of moderate-intensity exercise (any activity that increases your heart rate and causes you to sweat) each week. In addition, most adults need muscle-strengthening exercises on 2 or more days a week.  Maintain a healthy weight. The body mass index (BMI) is a screening tool to identify possible weight problems. It provides an estimate of body fat based on height and weight. Your health care provider can find your BMI and can help you achieve or maintain a healthy weight.For adults 20 years and older:  A BMI below 18.5 is considered underweight.  A BMI of 18.5 to 24.9 is normal.  A BMI of 25 to 29.9 is considered overweight.  A  BMI of 30 and above is considered obese.  Maintain normal blood lipids and cholesterol levels by exercising and minimizing your intake of saturated fat. Eat a balanced diet with plenty of fruit and vegetables. Blood tests for lipids and cholesterol should begin at age 45 and be repeated every 5 years. If your lipid or cholesterol levels are high, you are over 50, or you are at high risk for heart disease, you may need your cholesterol levels checked more frequently.Ongoing high lipid and cholesterol levels should be treated with medicines if diet and exercise are not working.  If you smoke, find out from your health care provider how to quit. If you do not use tobacco, do not start.  Lung cancer screening is recommended for adults aged 45-80 years who are at high risk for developing lung cancer because of a history of smoking. A yearly low-dose CT scan of the lungs is recommended for people who have at least a 30-pack-year history of smoking and are a current smoker or have quit within the past 15 years. A pack year of smoking is smoking an average of 1 pack of cigarettes a day for 1 year (for example: 1 pack a day for 30 years or 2 packs a day for 15 years). Yearly screening should continue until the smoker has stopped smoking for at least 15 years. Yearly screening should be stopped for people who develop a health problem that would prevent them from having lung cancer treatment.  If you are pregnant, do not drink alcohol. If you are  breastfeeding, be very cautious about drinking alcohol. If you are not pregnant and choose to drink alcohol, do not have more than 1 drink per day. One drink is considered to be 12 ounces (355 mL) of beer, 5 ounces (148 mL) of wine, or 1.5 ounces (44 mL) of liquor.  Avoid use of street drugs. Do not share needles with anyone. Ask for help if you need support or instructions about stopping the use of drugs.  High blood pressure causes heart disease and increases the risk  of stroke. Your blood pressure should be checked at least every 1 to 2 years. Ongoing high blood pressure should be treated with medicines if weight loss and exercise do not work.  If you are 55-79 years old, ask your health care provider if you should take aspirin to prevent strokes.  Diabetes screening is done by taking a blood sample to check your blood glucose level after you have not eaten for a certain period of time (fasting). If you are not overweight and you do not have risk factors for diabetes, you should be screened once every 3 years starting at age 45. If you are overweight or obese and you are 40-70 years of age, you should be screened for diabetes every year as part of your cardiovascular risk assessment.  Breast cancer screening is essential preventive care for women. You should practice "breast self-awareness." This means understanding the normal appearance and feel of your breasts and may include breast self-examination. Any changes detected, no matter how small, should be reported to a health care provider. Women in their 20s and 30s should have a clinical breast exam (CBE) by a health care provider as part of a regular health exam every 1 to 3 years. After age 40, women should have a CBE every year. Starting at age 40, women should consider having a mammogram (breast X-ray test) every year. Women who have a family history of breast cancer should talk to their health care provider about genetic screening. Women at a high risk of breast cancer should talk to their health care providers about having an MRI and a mammogram every year.  Breast cancer gene (BRCA)-related cancer risk assessment is recommended for women who have family members with BRCA-related cancers. BRCA-related cancers include breast, ovarian, tubal, and peritoneal cancers. Having family members with these cancers may be associated with an increased risk for harmful changes (mutations) in the breast cancer genes BRCA1 and  BRCA2. Results of the assessment will determine the need for genetic counseling and BRCA1 and BRCA2 testing.  Your health care provider may recommend that you be screened regularly for cancer of the pelvic organs (ovaries, uterus, and vagina). This screening involves a pelvic examination, including checking for microscopic changes to the surface of your cervix (Pap test). You may be encouraged to have this screening done every 3 years, beginning at age 21.  For women ages 30-65, health care providers may recommend pelvic exams and Pap testing every 3 years, or they may recommend the Pap and pelvic exam, combined with testing for human papilloma virus (HPV), every 5 years. Some types of HPV increase your risk of cervical cancer. Testing for HPV may also be done on women of any age with unclear Pap test results.  Other health care providers may not recommend any screening for nonpregnant women who are considered low risk for pelvic cancer and who do not have symptoms. Ask your health care provider if a screening pelvic exam is right for   you.  If you have had past treatment for cervical cancer or a condition that could lead to cancer, you need Pap tests and screening for cancer for at least 20 years after your treatment. If Pap tests have been discontinued, your risk factors (such as having a new sexual partner) need to be reassessed to determine if screening should resume. Some women have medical problems that increase the chance of getting cervical cancer. In these cases, your health care provider may recommend more frequent screening and Pap tests.  Colorectal cancer can be detected and often prevented. Most routine colorectal cancer screening begins at the age of 50 years and continues through age 75 years. However, your health care provider may recommend screening at an earlier age if you have risk factors for colon cancer. On a yearly basis, your health care provider may provide home test kits to check  for hidden blood in the stool. Use of a small camera at the end of a tube, to directly examine the colon (sigmoidoscopy or colonoscopy), can detect the earliest forms of colorectal cancer. Talk to your health care provider about this at age 50, when routine screening begins. Direct exam of the colon should be repeated every 5-10 years through age 75 years, unless early forms of precancerous polyps or small growths are found.  People who are at an increased risk for hepatitis B should be screened for this virus. You are considered at high risk for hepatitis B if:  You were born in a country where hepatitis B occurs often. Talk with your health care provider about which countries are considered high risk.  Your parents were born in a high-risk country and you have not received a shot to protect against hepatitis B (hepatitis B vaccine).  You have HIV or AIDS.  You use needles to inject street drugs.  You live with, or have sex with, someone who has hepatitis B.  You get hemodialysis treatment.  You take certain medicines for conditions like cancer, organ transplantation, and autoimmune conditions.  Hepatitis C blood testing is recommended for all people born from 1945 through 1965 and any individual with known risks for hepatitis C.  Practice safe sex. Use condoms and avoid high-risk sexual practices to reduce the spread of sexually transmitted infections (STIs). STIs include gonorrhea, chlamydia, syphilis, trichomonas, herpes, HPV, and human immunodeficiency virus (HIV). Herpes, HIV, and HPV are viral illnesses that have no cure. They can result in disability, cancer, and death.  You should be screened for sexually transmitted illnesses (STIs) including gonorrhea and chlamydia if:  You are sexually active and are younger than 24 years.  You are older than 24 years and your health care provider tells you that you are at risk for this type of infection.  Your sexual activity has changed  since you were last screened and you are at an increased risk for chlamydia or gonorrhea. Ask your health care provider if you are at risk.  If you are at risk of being infected with HIV, it is recommended that you take a prescription medicine daily to prevent HIV infection. This is called preexposure prophylaxis (PrEP). You are considered at risk if:  You are sexually active and do not regularly use condoms or know the HIV status of your partner(s).  You take drugs by injection.  You are sexually active with a partner who has HIV.  Talk with your health care provider about whether you are at high risk of being infected with HIV. If   you choose to begin PrEP, you should first be tested for HIV. You should then be tested every 3 months for as long as you are taking PrEP.  Osteoporosis is a disease in which the bones lose minerals and strength with aging. This can result in serious bone fractures or breaks. The risk of osteoporosis can be identified using a bone density scan. Women ages 67 years and over and women at risk for fractures or osteoporosis should discuss screening with their health care providers. Ask your health care provider whether you should take a calcium supplement or vitamin D to reduce the rate of osteoporosis.  Menopause can be associated with physical symptoms and risks. Hormone replacement therapy is available to decrease symptoms and risks. You should talk to your health care provider about whether hormone replacement therapy is right for you.  Use sunscreen. Apply sunscreen liberally and repeatedly throughout the day. You should seek shade when your shadow is shorter than you. Protect yourself by wearing long sleeves, pants, a wide-brimmed hat, and sunglasses year round, whenever you are outdoors.  Once a month, do a whole body skin exam, using a mirror to look at the skin on your back. Tell your health care provider of new moles, moles that have irregular borders, moles that  are larger than a pencil eraser, or moles that have changed in shape or color.  Stay current with required vaccines (immunizations).  Influenza vaccine. All adults should be immunized every year.  Tetanus, diphtheria, and acellular pertussis (Td, Tdap) vaccine. Pregnant women should receive 1 dose of Tdap vaccine during each pregnancy. The dose should be obtained regardless of the length of time since the last dose. Immunization is preferred during the 27th-36th week of gestation. An adult who has not previously received Tdap or who does not know her vaccine status should receive 1 dose of Tdap. This initial dose should be followed by tetanus and diphtheria toxoids (Td) booster doses every 10 years. Adults with an unknown or incomplete history of completing a 3-dose immunization series with Td-containing vaccines should begin or complete a primary immunization series including a Tdap dose. Adults should receive a Td booster every 10 years.  Varicella vaccine. An adult without evidence of immunity to varicella should receive 2 doses or a second dose if she has previously received 1 dose. Pregnant females who do not have evidence of immunity should receive the first dose after pregnancy. This first dose should be obtained before leaving the health care facility. The second dose should be obtained 4-8 weeks after the first dose.  Human papillomavirus (HPV) vaccine. Females aged 13-26 years who have not received the vaccine previously should obtain the 3-dose series. The vaccine is not recommended for use in pregnant females. However, pregnancy testing is not needed before receiving a dose. If a female is found to be pregnant after receiving a dose, no treatment is needed. In that case, the remaining doses should be delayed until after the pregnancy. Immunization is recommended for any person with an immunocompromised condition through the age of 61 years if she did not get any or all doses earlier. During the  3-dose series, the second dose should be obtained 4-8 weeks after the first dose. The third dose should be obtained 24 weeks after the first dose and 16 weeks after the second dose.  Zoster vaccine. One dose is recommended for adults aged 30 years or older unless certain conditions are present.  Measles, mumps, and rubella (MMR) vaccine. Adults born  before 1957 generally are considered immune to measles and mumps. Adults born in 1957 or later should have 1 or more doses of MMR vaccine unless there is a contraindication to the vaccine or there is laboratory evidence of immunity to each of the three diseases. A routine second dose of MMR vaccine should be obtained at least 28 days after the first dose for students attending postsecondary schools, health care workers, or international travelers. People who received inactivated measles vaccine or an unknown type of measles vaccine during 1963-1967 should receive 2 doses of MMR vaccine. People who received inactivated mumps vaccine or an unknown type of mumps vaccine before 1979 and are at high risk for mumps infection should consider immunization with 2 doses of MMR vaccine. For females of childbearing age, rubella immunity should be determined. If there is no evidence of immunity, females who are not pregnant should be vaccinated. If there is no evidence of immunity, females who are pregnant should delay immunization until after pregnancy. Unvaccinated health care workers born before 1957 who lack laboratory evidence of measles, mumps, or rubella immunity or laboratory confirmation of disease should consider measles and mumps immunization with 2 doses of MMR vaccine or rubella immunization with 1 dose of MMR vaccine.  Pneumococcal 13-valent conjugate (PCV13) vaccine. When indicated, a person who is uncertain of his immunization history and has no record of immunization should receive the PCV13 vaccine. All adults 65 years of age and older should receive this  vaccine. An adult aged 19 years or older who has certain medical conditions and has not been previously immunized should receive 1 dose of PCV13 vaccine. This PCV13 should be followed with a dose of pneumococcal polysaccharide (PPSV23) vaccine. Adults who are at high risk for pneumococcal disease should obtain the PPSV23 vaccine at least 8 weeks after the dose of PCV13 vaccine. Adults older than 40 years of age who have normal immune system function should obtain the PPSV23 vaccine dose at least 1 year after the dose of PCV13 vaccine.  Pneumococcal polysaccharide (PPSV23) vaccine. When PCV13 is also indicated, PCV13 should be obtained first. All adults aged 65 years and older should be immunized. An adult younger than age 65 years who has certain medical conditions should be immunized. Any person who resides in a nursing home or long-term care facility should be immunized. An adult smoker should be immunized. People with an immunocompromised condition and certain other conditions should receive both PCV13 and PPSV23 vaccines. People with human immunodeficiency virus (HIV) infection should be immunized as soon as possible after diagnosis. Immunization during chemotherapy or radiation therapy should be avoided. Routine use of PPSV23 vaccine is not recommended for American Indians, [place] Natives, or people younger than 65 years unless there are medical conditions that require PPSV23 vaccine. When indicated, people who have unknown immunization and have no record of immunization should receive PPSV23 vaccine. One-time revaccination 5 years after the first dose of PPSV23 is recommended for people aged 19-64 years who have chronic kidney failure, nephrotic syndrome, asplenia, or immunocompromised conditions. People who received 1-2 doses of PPSV23 before age 65 years should receive another dose of PPSV23 vaccine at age 65 years or later if at least 5 years have passed since the previous dose. Doses of PPSV23 are not  needed for people immunized with PPSV23 at or after age 65 years.  Meningococcal vaccine. Adults with asplenia or persistent complement component deficiencies should receive 2 doses of quadrivalent meningococcal conjugate (MenACWY-D) vaccine. The doses should be obtained   at least 2 months apart. Microbiologists working with certain meningococcal bacteria, [place] recruits, people at risk during an outbreak, and people who travel to or live in countries with a high rate of meningitis should be immunized. A first-year college student up through age 34 years who is living in a residence hall should receive a dose if she did not receive a dose on or after her 16th birthday. Adults who have certain high-risk conditions should receive one or more doses of vaccine.  Hepatitis A vaccine. Adults who wish to be protected from this disease, have certain high-risk conditions, work with hepatitis A-infected animals, work in hepatitis A research labs, or travel to or work in countries with a high rate of hepatitis A should be immunized. Adults who were previously unvaccinated and who anticipate close contact with an international adoptee during the first 60 days after arrival in the [place] from a country with a high rate of hepatitis A should be immunized.  Hepatitis B vaccine. Adults who wish to be protected from this disease, have certain high-risk conditions, may be exposed to blood or other infectious body fluids, are household contacts or sex partners of hepatitis B positive people, are clients or workers in certain care facilities, or travel to or work in countries with a high rate of hepatitis B should be immunized.  Haemophilus influenzae type b (Hib) vaccine. A previously unvaccinated person with asplenia or sickle cell disease or having a scheduled splenectomy should receive 1 dose of Hib vaccine. Regardless of previous immunization, a recipient of a hematopoietic stem cell transplant should receive a  3-dose series 6-12 months after her successful transplant. Hib vaccine is not recommended for adults with HIV infection. Preventive Services / Frequency Ages 35 to 4 years  Blood pressure check.** / Every 3-5 years.  Lipid and cholesterol check.** / Every 5 years beginning at age 60.  Clinical breast exam.** / Every 3 years for women in their 71s and 10s.  BRCA-related cancer risk assessment.** / For women who have family members with a BRCA-related cancer (breast, ovarian, tubal, or peritoneal cancers).  Pap test.** / Every 2 years from ages 76 through 26. Every 3 years starting at age 61 through age 76 or 93 with a history of 3 consecutive normal Pap tests.  HPV screening.** / Every 3 years from ages 37 through ages 60 to 51 with a history of 3 consecutive normal Pap tests.  Hepatitis C blood test.** / For any individual with known risks for hepatitis C.  Skin self-exam. / Monthly.  Influenza vaccine. / Every year.  Tetanus, diphtheria, and acellular pertussis (Tdap, Td) vaccine.** / Consult your health care provider. Pregnant women should receive 1 dose of Tdap vaccine during each pregnancy. 1 dose of Td every 10 years.  Varicella vaccine.** / Consult your health care provider. Pregnant females who do not have evidence of immunity should receive the first dose after pregnancy.  HPV vaccine. / 3 doses over 6 months, if 93 and younger. The vaccine is not recommended for use in pregnant females. However, pregnancy testing is not needed before receiving a dose.  Measles, mumps, rubella (MMR) vaccine.** / You need at least 1 dose of MMR if you were born in 1957 or later. You may also need a 2nd dose. For females of childbearing age, rubella immunity should be determined. If there is no evidence of immunity, females who are not pregnant should be vaccinated. If there is no evidence of immunity, females who are  pregnant should delay immunization until after pregnancy.  Pneumococcal  13-valent conjugate (PCV13) vaccine.** / Consult your health care provider.  Pneumococcal polysaccharide (PPSV23) vaccine.** / 1 to 2 doses if you smoke cigarettes or if you have certain conditions.  Meningococcal vaccine.** / 1 dose if you are age 68 to 8 years and a Market researcher living in a residence hall, or have one of several medical conditions, you need to get vaccinated against meningococcal disease. You may also need additional booster doses.  Hepatitis A vaccine.** / Consult your health care provider.  Hepatitis B vaccine.** / Consult your health care provider.  Haemophilus influenzae type b (Hib) vaccine.** / Consult your health care provider. Ages 7 to 53 years  Blood pressure check.** / Every year.  Lipid and cholesterol check.** / Every 5 years beginning at age 25 years.  Lung cancer screening. / Every year if you are aged 11-80 years and have a 30-pack-year history of smoking and currently smoke or have quit within the past 15 years. Yearly screening is stopped once you have quit smoking for at least 15 years or develop a health problem that would prevent you from having lung cancer treatment.  Clinical breast exam.** / Every year after age 48 years.  BRCA-related cancer risk assessment.** / For women who have family members with a BRCA-related cancer (breast, ovarian, tubal, or peritoneal cancers).  Mammogram.** / Every year beginning at age 41 years and continuing for as long as you are in good health. Consult with your health care provider.  Pap test.** / Every 3 years starting at age 65 years through age 37 or 70 years with a history of 3 consecutive normal Pap tests.  HPV screening.** / Every 3 years from ages 72 years through ages 60 to 40 years with a history of 3 consecutive normal Pap tests.  Fecal occult blood test (FOBT) of stool. / Every year beginning at age 21 years and continuing until age 5 years. You may not need to do this test if you get  a colonoscopy every 10 years.  Flexible sigmoidoscopy or colonoscopy.** / Every 5 years for a flexible sigmoidoscopy or every 10 years for a colonoscopy beginning at age 35 years and continuing until age 48 years.  Hepatitis C blood test.** / For all people born from [birth year] through 1965 and any individual with known risks for hepatitis C.  Skin self-exam. / Monthly.  Influenza vaccine. / Every year.  Tetanus, diphtheria, and acellular pertussis (Tdap/Td) vaccine.** / Consult your health care provider. Pregnant women should receive 1 dose of Tdap vaccine during each pregnancy. 1 dose of Td every 10 years.  Varicella vaccine.** / Consult your health care provider. Pregnant females who do not have evidence of immunity should receive the first dose after pregnancy.  Zoster vaccine.** / 1 dose for adults aged 30 years or older.  Measles, mumps, rubella (MMR) vaccine.** / You need at least 1 dose of MMR if you were born in 1957 or later. You may also need a second dose. For females of childbearing age, rubella immunity should be determined. If there is no evidence of immunity, females who are not pregnant should be vaccinated. If there is no evidence of immunity, females who are pregnant should delay immunization until after pregnancy.  Pneumococcal 13-valent conjugate (PCV13) vaccine.** / Consult your health care provider.  Pneumococcal polysaccharide (PPSV23) vaccine.** / 1 to 2 doses if you smoke cigarettes or if you have certain conditions.  Meningococcal vaccine.** /  Consult your health care provider.  Hepatitis A vaccine.** / Consult your health care provider.  Hepatitis B vaccine.** / Consult your health care provider.  Haemophilus influenzae type b (Hib) vaccine.** / Consult your health care provider. Ages 31 years and over  Blood pressure check.** / Every year.  Lipid and cholesterol check.** / Every 5 years beginning at age 46 years.  Lung cancer screening. / Every year if you  are aged 82-80 years and have a 30-pack-year history of smoking and currently smoke or have quit within the past 15 years. Yearly screening is stopped once you have quit smoking for at least 15 years or develop a health problem that would prevent you from having lung cancer treatment.  Clinical breast exam.** / Every year after age 61 years.  BRCA-related cancer risk assessment.** / For women who have family members with a BRCA-related cancer (breast, ovarian, tubal, or peritoneal cancers).  Mammogram.** / Every year beginning at age 10 years and continuing for as long as you are in good health. Consult with your health care provider.  Pap test.** / Every 3 years starting at age 41 years through age 31 or 33 years with 3 consecutive normal Pap tests. Testing can be stopped between 65 and 70 years with 3 consecutive normal Pap tests and no abnormal Pap or HPV tests in the past 10 years.  HPV screening.** / Every 3 years from ages 45 years through ages 31 or 16 years with a history of 3 consecutive normal Pap tests. Testing can be stopped between 65 and 70 years with 3 consecutive normal Pap tests and no abnormal Pap or HPV tests in the past 10 years.  Fecal occult blood test (FOBT) of stool. / Every year beginning at age 56 years and continuing until age 32 years. You may not need to do this test if you get a colonoscopy every 10 years.  Flexible sigmoidoscopy or colonoscopy.** / Every 5 years for a flexible sigmoidoscopy or every 10 years for a colonoscopy beginning at age 86 years and continuing until age 70 years.  Hepatitis C blood test.** / For all people born from [birth year] through 1965 and any individual with known risks for hepatitis C.  Osteoporosis screening.** / A one-time screening for women ages 74 years and over and women at risk for fractures or osteoporosis.  Skin self-exam. / Monthly.  Influenza vaccine. / Every year.  Tetanus, diphtheria, and acellular pertussis (Tdap/Td)  vaccine.** / 1 dose of Td every 10 years.  Varicella vaccine.** / Consult your health care provider.  Zoster vaccine.** / 1 dose for adults aged 55 years or older.  Pneumococcal 13-valent conjugate (PCV13) vaccine.** / Consult your health care provider.  Pneumococcal polysaccharide (PPSV23) vaccine.** / 1 dose for all adults aged 43 years and older.  Meningococcal vaccine.** / Consult your health care provider.  Hepatitis A vaccine.** / Consult your health care provider.  Hepatitis B vaccine.** / Consult your health care provider.  Haemophilus influenzae type b (Hib) vaccine.** / Consult your health care provider. ** Family history and personal history of risk and conditions may change your health care provider's recommendations.   This information is not intended to replace advice given to you by your health care provider. Make sure you discuss any questions you have with your health care provider.   Document Released: 08/21/2001 Document Revised: 07/16/2014 Document Reviewed: 11/20/2010 Elsevier Interactive Patient Education [employer].

## 2015-10-24 NOTE — Progress Notes (Signed)
Subjective:     Lydia Davis is a 40 y.o. female and is here for a comprehensive physical exam. The patient reports depression and trouble with marriage.  She wants to hold off on treatment or counseling until her daughter starts kinderrgarten in august.    Social History   Social History  . Marital Status: Married    Spouse Name: N/A  . Number of Children: N/A  . Years of Education: N/A   Occupational History  . 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 Smoking status: Never Smoker   . Smokeless tobacco: Not on file  . Alcohol Use: 0.0 oz/week    0 Standard drinks or equivalent per week     Comment: occasionally---glass of wine  . Drug Use: No  . Sexual Activity:    Partners: Male   Other Topics Concern  . Not on file   Social History Narrative   Health Maintenance  Topic Date Due  . HIV Screening  02/01/2016 (Originally 12/28/1990)  . INFLUENZA VACCINE  02/07/2016  . PAP SMEAR  01/06/2017  . TETANUS/TDAP  06/17/2017    The following portions of the patient's history were reviewed and updated as appropriate:  She  has a past medical history of History of TB skin testing. She  does not have any pertinent problems on file. She  has past surgical history that includes Cesarean section (2012) and Tonsillectomy (1994). Her family history includes Diabetes in her maternal grandfather; Hyperlipidemia in her paternal grandfather; Hypertension in her father. She  reports that she has never smoked. She does not have any smokeless tobacco history on file. She reports that she drinks alcohol. She reports that she does not use illicit drugs. She has a current medication list which includes the following prescription(s): norethindrone acetate-ethinyl estrad-fe. No current outpatient prescriptions on file prior to visit.   No current facility-administered medications on file prior to visit.   She is allergic to augmentin..  Review of  Systems Review of Systems  Constitutional: Negative for activity change, appetite change and fatigue.  HENT: Negative for hearing loss, congestion, tinnitus and ear discharge.  dentist q25m Eyes: Negative for visual disturbance (see optho q1y -- vision corrected to 20/20 with glasses).  Respiratory: Negative for cough, chest tightness and shortness of breath.   Cardiovascular: Negative for chest pain, palpitations and leg swelling.  Gastrointestinal: Negative for abdominal pain, diarrhea, constipation and abdominal distention.  Genitourinary: Negative for urgency, frequency, decreased urine volume and difficulty urinating.  Musculoskeletal: Negative for back pain, arthralgias and gait problem.  Skin: Negative for color change, pallor and rash.  Neurological: Negative for dizziness, light-headedness, numbness and headaches.  Hematological: Negative for adenopathy. Does not bruise/bleed easily.  Psychiatric/Behavioral: Negative for suicidal ideas, confusion, sleep disturbance, self-injury, dysphoric mood, decreased concentration and agitation.       Objective:    BP 117/80 mmHg  Pulse 74  Temp(Src) 98.1 F (36.7 C) (Oral)  Ht 5\' 8"  (1.727 m)  Wt 156 lb 6.4 oz (70.943 kg)  BMI 23.79 kg/m2  SpO2 98%  LMP 10/22/2015 General appearance: alert, cooperative, appears stated age and no distress Head: Normocephalic, without obvious abnormality, atraumatic Eyes: conjunctivae/corneas clear. PERRL, EOM's intact. Fundi benign. Ears: normal TM's and external ear canals both ears Nose: Nares normal. Septum midline. Mucosa normal. No drainage or sinus tenderness. Throat: lips, mucosa, and tongue normal; teeth and gums normal Neck: no adenopathy, no carotid bruit, no  JVD, supple, symmetrical, trachea midline and thyroid not enlarged, symmetric, no tenderness/mass/nodules Back: symmetric, no curvature. ROM normal. No CVA tenderness. Lungs: clear to auscultation bilaterally Breasts: normal  appearance, no masses or tenderness Heart: regular rate and rhythm, S1, S2 normal, no murmur, click, rub or gallop Abdomen: soft, non-tender; bowel sounds normal; no masses,  no organomegaly Pelvic: deferred Extremities: extremities normal, atraumatic, no cyanosis or edema Pulses: 2+ and symmetric Skin: Skin color, texture, turgor normal. No rashes or lesions Lymph nodes: Cervical, supraclavicular, and axillary nodes normal. Neurologic: Alert and oriented X 3, normal strength and tone. Normal symmetric reflexes. Normal coordination and gait Psych- no depression , no anxiety      Assessment:    Healthy female exam.     Plan:     See After Visit Summary for Counseling Recommendations   Check labs ghm utd  1. Encounter for contraceptive management, unspecified encounter   - Norethindrone Acetate-Ethinyl Estrad-FE (LOMEDIA 24 FE) 1-20 MG-MCG(24) tablet; Take 1 tablet by mouth daily.  Dispense: 3 Package; Refill: 4  2. Preventative health care See above - CBC with Differential/Platelet - Lipid panel - POCT urinalysis dipstick - TSH - Vitamin B12 - Vitamin D 1,25 dihydroxy

## 2015-10-24 NOTE — Progress Notes (Signed)
Pre visit review using our clinic review tool, if applicable. No additional management support is needed unless otherwise documented below in the visit note. 

## 2015-10-27 LAB — VITAMIN D 1,25 DIHYDROXY
VITAMIN D 1, 25 (OH) TOTAL: 73 pg/mL — AB (ref 18–72)
VITAMIN D3 1, 25 (OH): 73 pg/mL

## 2015-10-31 ENCOUNTER — Telehealth: Payer: Self-pay | Admitting: Family Medicine

## 2015-10-31 NOTE — Telephone Encounter (Signed)
Caller name:Florentina  Relationship to patient:self Can be reached:[phone] Pharmacy:  Reason for call:returning call in regards to lab work

## 2015-11-02 MED ORDER — "SYRINGE/NEEDLE (DISP) 25G X 5/8"" 3 ML MISC": Status: DC

## 2015-11-02 MED ORDER — CYANOCOBALAMIN 1000 MCG/ML IJ SOLN: 1000.0000 ug | INTRAMUSCULAR | Status: DC

## 2015-11-02 NOTE — Telephone Encounter (Signed)
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    KP

## 2015-11-24 ENCOUNTER — Telehealth: Payer: Self-pay | Admitting: Family Medicine

## 2015-11-24 ENCOUNTER — Other Ambulatory Visit: Payer: Self-pay | Admitting: *Deleted

## 2015-11-24 DIAGNOSIS — E559 Vitamin D deficiency, unspecified: Secondary | ICD-10-CM

## 2015-11-24 NOTE — Telephone Encounter (Signed)
Future orders placed. MyChart message sent to pt to schedule lab appt.

## 2015-11-24 NOTE — Telephone Encounter (Signed)
Ok to check --- dx vita d deficiency

## 2015-11-24 NOTE — Telephone Encounter (Addendum)
°  Relation to [id number] Call back [phone]   Reason for call:  Patient would like her Vitamin D checked, requesting orders

## 2015-11-24 NOTE — Telephone Encounter (Signed)
Last Vitamin D 10/24/15 was 73 (normal). Please advise.

## 2015-11-28 ENCOUNTER — Other Ambulatory Visit: Payer: BLUE CROSS/BLUE SHIELD

## 2015-12-06 ENCOUNTER — Other Ambulatory Visit (INDEPENDENT_AMBULATORY_CARE_PROVIDER_SITE_OTHER): Payer: BLUE CROSS/BLUE SHIELD

## 2015-12-06 DIAGNOSIS — E559 Vitamin D deficiency, unspecified: Secondary | ICD-10-CM | POA: Diagnosis not present

## 2015-12-06 DIAGNOSIS — E538 Deficiency of other specified B group vitamins: Secondary | ICD-10-CM

## 2015-12-06 LAB — VITAMIN D 25 HYDROXY (VIT D DEFICIENCY, FRACTURES): VITD: 37.92 ng/mL (ref 30.00–100.00)

## 2015-12-06 LAB — VITAMIN B12: VITAMIN B 12: 312 pg/mL (ref 211–911)

## 2015-12-06 NOTE — Addendum Note (Signed)
Addended by: Peggyann Shoals on: 12/06/2015 09:09 AM   Modules accepted: Orders

## 2015-12-07 ENCOUNTER — Encounter: Payer: Self-pay | Admitting: Family Medicine

## 2015-12-07 NOTE — Telephone Encounter (Signed)
Notes Recorded by Ann Held, DO on 12/06/2015 at 1:31 PM Low normal b12-- may benefit from 12 injections weekly x4 Then monthly Vita D low normal as well--- vita D 50000 weekly, #4 , 2 refills  Then take vita d 3 2000u daily  Recheck b12 and vita  D in 3 months  Sent mychart message with above info and to let us know if she wants to proceed with recommendations.

## 2015-12-09 ENCOUNTER — Other Ambulatory Visit: Payer: Self-pay | Admitting: Family Medicine

## 2015-12-09 DIAGNOSIS — E559 Vitamin D deficiency, unspecified: Secondary | ICD-10-CM

## 2015-12-09 MED ORDER — VITAMIN D (ERGOCALCIFEROL) 1.25 MG (50000 UNIT) PO CAPS: 50000.0000 [IU] | ORAL_CAPSULE | ORAL | Status: DC

## 2016-07-31 ENCOUNTER — Ambulatory Visit: Payer: BLUE CROSS/BLUE SHIELD | Admitting: Internal Medicine

## 2016-08-02 ENCOUNTER — Encounter: Payer: Self-pay | Admitting: Internal Medicine

## 2016-08-02 ENCOUNTER — Ambulatory Visit (INDEPENDENT_AMBULATORY_CARE_PROVIDER_SITE_OTHER): Payer: 59 | Admitting: Internal Medicine

## 2016-08-02 VITALS — BP 118/76 | HR 80 | Temp 97.8°F | Resp 12 | Ht 68.0 in | Wt 155.1 lb

## 2016-08-02 DIAGNOSIS — L239 Allergic contact dermatitis, unspecified cause: Secondary | ICD-10-CM

## 2016-08-02 MED ORDER — BETAMETHASONE DIPROPIONATE AUG 0.05 % EX CREA: TOPICAL_CREAM | Freq: Two times a day (BID) | CUTANEOUS | 0 refills | Status: DC

## 2016-08-02 NOTE — Progress Notes (Signed)
Pre visit review using our clinic review tool, if applicable. No additional management support is needed unless otherwise documented below in the visit note. 

## 2016-08-02 NOTE — Progress Notes (Signed)
Subjective:    Patient ID: Lydia Davis, female    DOB: [birth year], 41 y.o.   MRN: [id number]  DOS:  08/02/2016 Type of visit - description : acute Interval history: Long history of contact dermatitis, previously eval  in [place], had a number of tests and they come back negative. 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 She has blisters and itching in patches on her hands and distal upper extremities, symptoms are on-off but  at times debilitating. She avoids latex consistently   Review of Systems   Past Medical History:  Diagnosis Date  . History of TB skin testing    postive    Past Surgical History:  Procedure Laterality Date  . CESAREAN SECTION  2012  . TONSILLECTOMY  1994    Social History   Social History  . Marital status: Married    Spouse name: N/A  . Number of children: N/A  . Years of education: N/A   Occupational History  . 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 Smoking status: Never Smoker  . Smokeless tobacco: Never Used  . Alcohol use 0.0 oz/week     Comment: occasionally---glass of wine  . Drug use: No  . Sexual activity: Yes    Partners: Male   Other Topics Concern  . Not on file   Social History Narrative  . No narrative on file      Allergies as of 08/02/2016      Reactions   Augmentin [amoxicillin-pot Clavulanate] Rash      Medication List       Accurate as of 08/02/16 11:29 AM. Always use your most recent med list.          cyanocobalamin 1000 MCG/ML injection Commonly known as:  (VITAMIN B-12) Inject 1 mL (1,000 mcg total) into the skin once a week.   Norethindrone Acetate-Ethinyl Estrad-FE 1-20 MG-MCG(24) tablet Commonly known as:  LOMEDIA 24 FE Take 1 tablet by mouth daily.   SYRINGE-NEEDLE (DISP) 3 ML 25G X 5/8" 3 ML Misc Commonly known as:  BD ECLIPSE SYRINGE Inject 1 ml of B-12 weekly for 4 weeks and then monthly   Vitamin D (Ergocalciferol)  50000 units Caps capsule Commonly known as:  DRISDOL Take 1 capsule (50,000 Units total) by mouth every 7 (seven) days.          Objective:   Physical Exam BP 118/76 (BP Location: Left Arm, Patient Position: Sitting, Cuff Size: Normal)   Pulse 80   Temp 97.8 F (36.6 C) (Oral)   Resp 12   Ht 5\' 8"  (1.727 m)   Wt 155 lb 2 oz (70.4 kg)   LMP 07/29/2016 (Exact Date)   SpO2 98%   BMI 23.59 kg/m   General:   Well developed, well nourished . NAD.   Skin:   Hands: Have few places of postinflammatory hyperpigmentation, no active blisters but she does have scattered 1 mm papular, slightly red lesions  Neurologic:  alert & oriented X3.  Speech normal, gait appropriate for age and unassisted Strength symmetric and appropriate for age.  Psych: Cognition and judgment appear intact.  Cooperative with normal attention span and concentration.  Behavior appropriate. No anxious or depressed appearing.    Assessment & Plan:   Hand dermatitis: As described above, rx a potent topical steroid, advised her to minimize handwashing by using gloves more frequently, we discussed possibly referral and she  feels that symptoms are somewhat debilitating so will refer to the allergist.

## 2016-08-02 NOTE — Patient Instructions (Signed)
Use the cream twice a day as needed  Try to minimize the hand washing using gloves more frequently

## 2016-10-29 ENCOUNTER — Telehealth: Payer: Self-pay

## 2016-10-29 ENCOUNTER — Ambulatory Visit (INDEPENDENT_AMBULATORY_CARE_PROVIDER_SITE_OTHER): Payer: 59 | Admitting: Family Medicine

## 2016-10-29 ENCOUNTER — Encounter: Payer: Self-pay | Admitting: Family Medicine

## 2016-10-29 VITALS — BP 122/78 | HR 82 | Temp 98.1°F | Resp 16 | Ht 67.8 in | Wt 157.2 lb

## 2016-10-29 DIAGNOSIS — Z0000 Encounter for general adult medical examination without abnormal findings: Secondary | ICD-10-CM | POA: Diagnosis not present

## 2016-10-29 DIAGNOSIS — L309 Dermatitis, unspecified: Secondary | ICD-10-CM

## 2016-10-29 DIAGNOSIS — L209 Atopic dermatitis, unspecified: Secondary | ICD-10-CM | POA: Diagnosis not present

## 2016-10-29 DIAGNOSIS — F419 Anxiety disorder, unspecified: Secondary | ICD-10-CM

## 2016-10-29 DIAGNOSIS — F329 Major depressive disorder, single episode, unspecified: Secondary | ICD-10-CM

## 2016-10-29 DIAGNOSIS — F32A Depression, unspecified: Secondary | ICD-10-CM

## 2016-10-29 LAB — COMPREHENSIVE METABOLIC PANEL
ALT: 7 U/L (ref 0–35)
AST: 15 U/L (ref 0–37)
Albumin: 3.9 g/dL (ref 3.5–5.2)
Alkaline Phosphatase: 42 U/L (ref 39–117)
BUN: 14 mg/dL (ref 6–23)
CALCIUM: 9.2 mg/dL (ref 8.4–10.5)
CHLORIDE: 102 meq/L (ref 96–112)
CO2: 27 mEq/L (ref 19–32)
Creatinine, Ser: 0.72 mg/dL (ref 0.40–1.20)
GFR: 94.95 mL/min (ref >60.00)
GLUCOSE: 84 mg/dL (ref 70–99)
Potassium: 3.9 mEq/L (ref 3.5–5.1)
Sodium: 136 mEq/L (ref 135–145)
Total Bilirubin: 0.7 mg/dL (ref 0.2–1.2)
Total Protein: 7.2 g/dL (ref 6.0–8.3)

## 2016-10-29 LAB — LIPID PANEL
Cholesterol: 169 mg/dL (ref 0–200)
HDL: 46.9 mg/dL (ref >39.00)
LDL CALC: 100 mg/dL — AB (ref 0–99)
NONHDL: 122.34
TRIGLYCERIDES: 114 mg/dL (ref 0.0–149.0)
Total CHOL/HDL Ratio: 4
VLDL: 22.8 mg/dL (ref 0.0–40.0)

## 2016-10-29 LAB — CBC WITH DIFFERENTIAL/PLATELET
BASOS PCT: 0.5 % (ref 0.0–3.0)
Basophils Absolute: 0 10*3/uL (ref 0.0–0.1)
EOS ABS: 0.1 10*3/uL (ref 0.0–0.7)
Eosinophils Relative: 1.6 % (ref 0.0–5.0)
HEMATOCRIT: 41.5 % (ref 36.0–46.0)
HEMOGLOBIN: 13.9 g/dL (ref 12.0–15.0)
LYMPHS PCT: 23.4 % (ref 12.0–46.0)
Lymphs Abs: 1.8 10*3/uL (ref 0.7–4.0)
MCHC: 33.6 g/dL (ref 30.0–36.0)
MCV: 89.5 fl (ref 78.0–100.0)
MONOS PCT: 4.9 % (ref 3.0–12.0)
Monocytes Absolute: 0.4 10*3/uL (ref 0.1–1.0)
NEUTROS ABS: 5.5 10*3/uL (ref 1.4–7.7)
Neutrophils Relative %: 69.6 % (ref 43.0–77.0)
PLATELETS: 279 10*3/uL (ref 150.0–400.0)
RBC: 4.63 Mil/uL (ref 3.87–5.11)
RDW: 12.1 % (ref 11.5–15.5)
WBC: 7.9 10*3/uL (ref 4.0–10.5)

## 2016-10-29 LAB — TSH: TSH: 1.99 u[IU]/mL (ref 0.35–4.50)

## 2016-10-29 MED ORDER — BUPROPION HCL ER (XL) 150 MG PO TB24: ORAL_TABLET | ORAL | 0 refills | Status: DC

## 2016-10-29 MED ORDER — CRISABOROLE 2 % EX OINT: 1.0000 "application " | TOPICAL_OINTMENT | Freq: Two times a day (BID) | CUTANEOUS | 2 refills | Status: DC

## 2016-10-29 MED ORDER — BUPROPION HCL ER (XL) 300 MG PO TB24: 300.0000 mg | ORAL_TABLET | Freq: Every day | ORAL | 2 refills | Status: DC

## 2016-10-29 NOTE — Progress Notes (Signed)
Pre visit review using our clinic review tool, if applicable. No additional management support is needed unless otherwise documented below in the visit note. 

## 2016-10-29 NOTE — Telephone Encounter (Signed)
PA initiated via Covermymeds; KEY: [id number]. Received real-time response: PA denied.   PA for Lydia Davis can be approved if Pt has tried and failed a topical corticosteroid (such as Elocon, Synalar, Lidex) AND a calcineurin inhibitor (such as Elidel, Protopic).   Please advise.

## 2016-10-29 NOTE — Assessment & Plan Note (Signed)
ghm utd Check labs See AVS 

## 2016-10-29 NOTE — Assessment & Plan Note (Signed)
Pt has f/u with derm Try eucrisa

## 2016-10-29 NOTE — Telephone Encounter (Signed)
She has tried a few others-- may need to see if pt remembers what she took

## 2016-10-29 NOTE — Progress Notes (Signed)
Patient ID: Lydia Davis, female   DOB: 05-Nov-1975, 41 y.o.   MRN: [id number]  I acted as a Education administrator for Dr. Carollee Herter.  Guerry Bruin, CMA  Subjective:     Lydia Davis is a 41 y.o. female and is here for a comprehensive physical exam. The patient reports problems with skin.  Diagnosed with eczema.  Washes hands a lot at work.  She sees dermatology.  Still feels anxious.  She works 3 days a week and goes to school.  She has a 41 year old that she takes care of mostly by herself because her husband works a different schedule in the evenings.  Social History   Social History  . Marital status: Married    Spouse name: N/A  . Number of children: N/A  . Years of education: N/A   Occupational History  . 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 Smoking status: Never Smoker  . Smokeless tobacco: Never Used  . Alcohol use 0.0 oz/week     Comment: occasionally---glass of wine  . Drug use: No  . Sexual activity: Yes    Partners: Male    Birth control/ protection: Pill   Other Topics Concern  . Not on file   Social History Narrative   Exercise--Walking      Health Maintenance  Topic Date Due  . HIV Screening  12/28/1990  . INFLUENZA VACCINE  02/06/2017  . TETANUS/TDAP  06/17/2017  . PAP SMEAR  01/07/2019    The following portions of the patient's history were reviewed and updated as appropriate:  She  has a past medical history of History of TB skin testing. She  does not have any pertinent problems on file. She  has a past surgical history that includes Cesarean section (2012) and Tonsillectomy (1994). Her family history includes Diabetes in her maternal grandfather; Hyperlipidemia in her paternal grandfather; Hypertension in her father. She  reports that she has never smoked. She has never used smokeless tobacco. She reports that she drinks alcohol. She reports that she does not use drugs. She has a current medication list which includes the  following prescription(s): augmented betamethasone dipropionate, multivitamin, norethindrone acetate-ethinyl estrad-fe, bupropion, bupropion, and crisaborole. Current Outpatient Prescriptions on File Prior to Visit  Medication Sig Dispense Refill  . augmented betamethasone dipropionate (DIPROLENE-AF) 0.05 % cream Apply topically 2 (two) times daily. 60 g 0  . Norethindrone Acetate-Ethinyl Estrad-FE (LOMEDIA 24 FE) 1-20 MG-MCG(24) tablet Take 1 tablet by mouth daily. 3 Package 4   No current facility-administered medications on file prior to visit.    She is allergic to augmentin [amoxicillin-pot clavulanate]..  Review of Systems Review of Systems  Constitutional: Negative for activity change, appetite change and fatigue.  HENT: Negative for hearing loss, congestion, tinnitus and ear discharge.  dentist q31m Eyes: Negative for visual disturbance (see optho q1y -- vision corrected to 20/20 with glasses).  Respiratory: Negative for cough, chest tightness and shortness of breath.   Cardiovascular: Negative for chest pain, palpitations and leg swelling.  Gastrointestinal: Negative for abdominal pain, diarrhea, constipation and abdominal distention.  Genitourinary: Negative for urgency, frequency, decreased urine volume and difficulty urinating.  Musculoskeletal: Negative for back pain, arthralgias and gait problem.  Skin--skin dry , itchy c/w eczema  Neurological: Negative for dizziness, light-headedness, numbness and headaches.  Hematological: Negative for adenopathy. Does not bruise/bleed easily.  Psychiatric/Behavioral: Negative for suicidal ideas, confusion, sleep disturbance, self-injury, dysphoric mood, decreased concentration and agitation.  Objective:    BP 122/78 (BP Location: Left Arm, Cuff Size: Normal)   Pulse 82   Temp 98.1 F (36.7 C) (Oral)   Resp 16   Ht 5' 7.8" (1.722 m)   Wt 157 lb 3.2 oz (71.3 kg)   LMP 10/20/2016   SpO2 98%   BMI 24.04 kg/m  General  appearance: alert, cooperative, appears stated age and no distress Head: Normocephalic, without obvious abnormality, atraumatic Eyes: conjunctivae/corneas clear. PERRL, EOM's intact. Fundi benign. Ears: normal TM's and external ear canals both ears Nose: Nares normal. Septum midline. Mucosa normal. No drainage or sinus tenderness. Throat: lips, mucosa, and tongue normal; teeth and gums normal Neck: no adenopathy, no carotid bruit, no JVD, supple, symmetrical, trachea midline and thyroid not enlarged, symmetric, no tenderness/mass/nodules Back: symmetric, no curvature. ROM normal. No CVA tenderness. Lungs: clear to auscultation bilaterally Heart: regular rate and rhythm, S1, S2 normal, no murmur, click, rub or gallop Abdomen: soft, non-tender; bowel sounds normal; no masses,  no organomegaly Extremities: extremities normal, atraumatic, no cyanosis or edema Pulses: 2+ and symmetric Skin: Skin color, texture, turgor normal. No rashes or lesions Lymph nodes: Cervical, supraclavicular, and axillary nodes normal. Neurologic: Alert and oriented X 3, normal strength and tone. Normal symmetric reflexes. Normal coordination and gait    Assessment:    Healthy female exam.      Plan:     See After Visit Summary for Counseling Recommendations    1. Preventative health care See above ghm utd Check labs - POCT Urinalysis Dipstick (Automated) - CBC with Differential/Platelet - Lipid panel - Comprehensive metabolic panel - TSH  2. Eczema, unspecified type 2 - Crisaborole 2 % OINT; Apply 1 application topically 2 (two) times daily.  Dispense: 60 g; Refill: 2  3. Anxiety and depression  - buPROPion (WELLBUTRIN XL) 150 MG 24 hr tablet; Take 1 tablet every morning for 1 week and then increase to 2 tablets every morning.  Dispense: 60 tablet; Refill: 0 - buPROPion (WELLBUTRIN XL) 300 MG 24 hr tablet; Take 1 tablet (300 mg total) by mouth daily.  Dispense: 30 tablet; Refill: 2  4. Atopic  dermatitis, unspecified type f/u derm Consider eucrisa

## 2016-10-29 NOTE — Patient Instructions (Signed)

## 2016-10-30 MED ORDER — MOMETASONE FUROATE 0.1 % EX CREA: 1.0000 "application " | TOPICAL_CREAM | Freq: Every day | CUTANEOUS | 1 refills | Status: DC

## 2016-10-30 NOTE — Telephone Encounter (Signed)
Sent in the [facility]. Called the patient left a message alternative has been sent in.

## 2016-10-30 NOTE — Telephone Encounter (Signed)
She has only tried the Diprolene AF cream.  She would be willing to try others if you choose.

## 2016-10-30 NOTE — Telephone Encounter (Signed)
Try elocon #60 g  Apply qd

## 2016-11-29 ENCOUNTER — Other Ambulatory Visit: Payer: Self-pay | Admitting: Family Medicine

## 2016-11-29 DIAGNOSIS — F329 Major depressive disorder, single episode, unspecified: Secondary | ICD-10-CM

## 2016-11-29 DIAGNOSIS — F419 Anxiety disorder, unspecified: Principal | ICD-10-CM

## 2016-12-13 ENCOUNTER — Encounter: Payer: Self-pay | Admitting: Family Medicine

## 2016-12-13 ENCOUNTER — Ambulatory Visit (INDEPENDENT_AMBULATORY_CARE_PROVIDER_SITE_OTHER): Payer: 59 | Admitting: Family Medicine

## 2016-12-13 VITALS — BP 132/86 | HR 88 | Temp 98.6°F | Resp 16 | Ht 68.0 in | Wt 154.6 lb

## 2016-12-13 DIAGNOSIS — F418 Other specified anxiety disorders: Secondary | ICD-10-CM | POA: Diagnosis not present

## 2016-12-13 MED ORDER — ESCITALOPRAM OXALATE 10 MG PO TABS: 10.0000 mg | ORAL_TABLET | Freq: Every day | ORAL | 2 refills | Status: DC

## 2016-12-13 MED ORDER — BUPROPION HCL ER (XL) 150 MG PO TB24: 150.0000 mg | ORAL_TABLET | Freq: Every day | ORAL | 2 refills | Status: DC

## 2016-12-13 NOTE — Patient Instructions (Signed)

## 2016-12-13 NOTE — Progress Notes (Signed)
Patient ID: Lydia Davis, female   DOB: Feb 15, 1976, 41 y.o.   MRN: [id number]     Subjective:  I acted as a Education administrator for Dr. Carollee Herter.  Guerry Bruin, [place]   Patient ID: Lydia Davis, female    DOB: 01-19-1976, 41 y.o.   MRN: [id number]  Chief Complaint  Patient presents with  . Depression    HPI  Patient is in today for follow up depression.  She feels like the Wellbutrin is not working.  She is having more anger, feeling more anxious, and still no desire to try exercise.  Patient Care Team: Carollee Herter, Alferd Apa, DO as PCP - General (Family Medicine)   Past Medical History:  Diagnosis Date  . History of TB skin testing    postive    Past Surgical History:  Procedure Laterality Date  . CESAREAN SECTION  2012  . TONSILLECTOMY  1994    Family History  Problem Relation Age of Onset  . Hypertension Father        and both sets of grandparents  . Hyperlipidemia Paternal Grandfather   . Heart disease Unknown        both sets of grandparents  . Diabetes Maternal Grandfather     Social History   Social History  . Marital status: Married    Spouse name: N/A  . Number of children: N/A  . Years of education: N/A   Occupational History  . 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 Smoking status: Never Smoker  . Smokeless tobacco: Never Used  . Alcohol use 0.0 oz/week     Comment: occasionally---glass of wine  . Drug use: No  . Sexual activity: Yes    Partners: Male    Birth control/ protection: Pill   Other Topics Concern  . Not on file   Social History Narrative   Exercise--Walking       Outpatient Medications Prior to Visit  Medication Sig Dispense Refill  . Multiple Vitamin (MULTIVITAMIN) tablet Take 1 tablet by mouth daily.    Marland Kitchen buPROPion (WELLBUTRIN XL) 300 MG 24 hr tablet Take 1 tablet (300 mg total) by mouth daily. 30 tablet 2  . augmented betamethasone dipropionate (DIPROLENE-AF) 0.05 % cream Apply topically 2  (two) times daily. 60 g 0  . buPROPion (WELLBUTRIN XL) 150 MG 24 hr tablet TAKE 1 TABLET EVERY MORNING FOR 1 WEEK AND THEN INCREASE TO 2 TABLETS EVERY MORNING. 60 tablet 0  . Crisaborole 2 % OINT Apply 1 application topically 2 (two) times daily. 60 g 2  . mometasone (ELOCON) 0.1 % cream Apply 1 application topically daily. 45 g 1  . Norethindrone Acetate-Ethinyl Estrad-FE (LOMEDIA 24 FE) 1-20 MG-MCG(24) tablet Take 1 tablet by mouth daily. 3 Package 4   No facility-administered medications prior to visit.     Allergies  Allergen Reactions  . Augmentin [Amoxicillin-Pot Clavulanate] Rash    Review of Systems  Constitutional: Negative for fever and malaise/fatigue.  HENT: Negative for congestion.   Eyes: Negative for blurred vision.  Respiratory: Negative for cough and shortness of breath.   Cardiovascular: Negative for chest pain, palpitations and leg swelling.  Gastrointestinal: Negative for vomiting.  Musculoskeletal: Negative for back pain.  Skin: Negative for rash.  Neurological: Negative for loss of consciousness and headaches.  Psychiatric/Behavioral: Positive for depression. The patient is nervous/anxious.        Objective:    Physical Exam  Constitutional: She is oriented  to person, place, and time. She appears well-developed and well-nourished. No distress.  HENT:  Head: Normocephalic and atraumatic.  Eyes: Conjunctivae are normal.  Neck: Normal range of motion. No thyromegaly present.  Cardiovascular: Normal rate and regular rhythm.   Pulmonary/Chest: Effort normal and breath sounds normal. She has no wheezes.  Abdominal: Soft. Bowel sounds are normal. There is no tenderness.  Musculoskeletal: Normal range of motion. She exhibits no edema or deformity.  Neurological: She is alert and oriented to person, place, and time.  Skin: Skin is warm and dry. She is not diaphoretic.  Psychiatric: She has a normal mood and affect.    BP 132/86 (BP Location: Left Arm, Cuff  Size: Normal)   Pulse 88   Temp 98.6 F (37 C) (Oral)   Resp 16   Ht 5\' 8"  (1.727 m)   Wt 154 lb 9.6 oz (70.1 kg)   LMP 11/16/2016   SpO2 98%   BMI 23.51 kg/m  Wt Readings from Last 3 Encounters:  12/13/16 154 lb 9.6 oz (70.1 kg)  10/29/16 157 lb 3.2 oz (71.3 kg)  08/02/16 155 lb 2 oz (70.4 kg)   BP Readings from Last 3 Encounters:  12/13/16 132/86  10/29/16 122/78  08/02/16 118/76     Immunization History  Administered Date(s) Administered  . Influenza-Unspecified 03/09/2014  . Tdap 06/18/2007    Health Maintenance  Topic Date Due  . HIV Screening  12/28/1990  . INFLUENZA VACCINE  02/06/2017  . TETANUS/TDAP  06/17/2017  . PAP SMEAR  01/07/2019    Lab Results  Component Value Date   WBC 7.9 10/29/2016   HGB 13.9 10/29/2016   HCT 41.5 10/29/2016   PLT 279.0 10/29/2016   GLUCOSE 84 10/29/2016   CHOL 169 10/29/2016   TRIG 114.0 10/29/2016   HDL 46.90 10/29/2016   LDLCALC 100 (H) 10/29/2016   ALT 7 10/29/2016   AST 15 10/29/2016   NA 136 10/29/2016   K 3.9 10/29/2016   CL 102 10/29/2016   CREATININE 0.72 10/29/2016   BUN 14 10/29/2016   CO2 27 10/29/2016   TSH 1.99 10/29/2016    Lab Results  Component Value Date   TSH 1.99 10/29/2016   Lab Results  Component Value Date   WBC 7.9 10/29/2016   HGB 13.9 10/29/2016   HCT 41.5 10/29/2016   MCV 89.5 10/29/2016   PLT 279.0 10/29/2016   Lab Results  Component Value Date   NA 136 10/29/2016   K 3.9 10/29/2016   CO2 27 10/29/2016   GLUCOSE 84 10/29/2016   BUN 14 10/29/2016   CREATININE 0.72 10/29/2016   BILITOT 0.7 10/29/2016   ALKPHOS 42 10/29/2016   AST 15 10/29/2016   ALT 7 10/29/2016   PROT 7.2 10/29/2016   ALBUMIN 3.9 10/29/2016   CALCIUM 9.2 10/29/2016   GFR 94.95 10/29/2016   Lab Results  Component Value Date   CHOL 169 10/29/2016   Lab Results  Component Value Date   HDL 46.90 10/29/2016   Lab Results  Component Value Date   LDLCALC 100 (H) 10/29/2016   Lab Results    Component Value Date   TRIG 114.0 10/29/2016   Lab Results  Component Value Date   CHOLHDL 4 10/29/2016   No results found for: HGBA1C       Assessment & Plan:   Problem List Items Addressed This Visit    None    Visit Diagnoses    Depression with anxiety    -  Primary   Relevant Medications   escitalopram (LEXAPRO) 10 MG tablet   buPROPion (WELLBUTRIN XL) 150 MG 24 hr tablet      con't counseling  rto 1 month  I have discontinued Ms. Osborne's Norethindrone Acetate-Ethinyl Estrad-FE, augmented betamethasone dipropionate, buPROPion, Crisaborole, mometasone, and buPROPion. I am also having her start on escitalopram and buPROPion. Additionally, I am having her maintain her multivitamin, triamcinolone ointment, doxycycline, and levonorgestrel.  Meds ordered this encounter  Medications  . triamcinolone ointment (KENALOG) 0.1 %  . doxycycline (VIBRAMYCIN) 100 MG capsule  . levonorgestrel (MIRENA) 20 MCG/24HR IUD    Sig: 1 each by Intrauterine route once.  . escitalopram (LEXAPRO) 10 MG tablet    Sig: Take 1 tablet (10 mg total) by mouth daily.    Dispense:  30 tablet    Refill:  2  . buPROPion (WELLBUTRIN XL) 150 MG 24 hr tablet    Sig: Take 1 tablet (150 mg total) by mouth daily.    Dispense:  30 tablet    Refill:  2    CMA served as scribe during this visit. History, Physical and Plan performed by medical provider. Documentation and orders reviewed and attested to.  Ann Held, DO

## 2016-12-18 ENCOUNTER — Ambulatory Visit: Payer: Self-pay | Admitting: Psychology

## 2016-12-18 ENCOUNTER — Telehealth: Payer: Self-pay | Admitting: Family Medicine

## 2016-12-18 MED ORDER — VENLAFAXINE HCL 75 MG PO TABS: 75.0000 mg | ORAL_TABLET | Freq: Every day | ORAL | 0 refills | Status: DC

## 2016-12-18 MED ORDER — VENLAFAXINE HCL 37.5 MG PO TABS: ORAL_TABLET | ORAL | 0 refills | Status: DC

## 2016-12-18 NOTE — Telephone Encounter (Signed)
Stop wellbutrin as well

## 2016-12-18 NOTE — Telephone Encounter (Signed)
Relation to QL:RJPV Call back number: [phone] Pharmacy:  Reason for call:  Patient states escitalopram (LEXAPRO) 10 MG tablet is causing her to feel drowsy and tired in need of clinical advice

## 2016-12-18 NOTE — Telephone Encounter (Signed)
Updated medication list. Informed her of instructions regarding medication. Should she continue on wellbutrin.

## 2016-12-18 NOTE — Telephone Encounter (Signed)
Called informed the patient of instructions.

## 2016-12-18 NOTE — Telephone Encounter (Signed)
D/c and change to effexor 37.5 mg #30  1 po qd x 1 week then inc to 2 a day and send effexor 75 mg #30  1 po qd after 3 weeks  F/u 1 month

## 2016-12-31 ENCOUNTER — Other Ambulatory Visit: Payer: Self-pay | Admitting: Family Medicine

## 2016-12-31 DIAGNOSIS — F419 Anxiety disorder, unspecified: Principal | ICD-10-CM

## 2016-12-31 DIAGNOSIS — F329 Major depressive disorder, single episode, unspecified: Secondary | ICD-10-CM

## 2016-12-31 DIAGNOSIS — F32A Depression, unspecified: Secondary | ICD-10-CM

## 2017-01-11 ENCOUNTER — Other Ambulatory Visit: Payer: Self-pay | Admitting: Family Medicine

## 2017-01-11 DIAGNOSIS — Z309 Encounter for contraceptive management, unspecified: Secondary | ICD-10-CM

## 2017-01-15 ENCOUNTER — Ambulatory Visit (INDEPENDENT_AMBULATORY_CARE_PROVIDER_SITE_OTHER): Payer: 59 | Admitting: Psychology

## 2017-01-15 DIAGNOSIS — F4323 Adjustment disorder with mixed anxiety and depressed mood: Secondary | ICD-10-CM | POA: Diagnosis not present

## 2017-01-17 ENCOUNTER — Ambulatory Visit: Payer: Self-pay | Admitting: Family Medicine

## 2017-02-05 ENCOUNTER — Ambulatory Visit (INDEPENDENT_AMBULATORY_CARE_PROVIDER_SITE_OTHER): Payer: 59 | Admitting: Psychology

## 2017-02-05 DIAGNOSIS — F4323 Adjustment disorder with mixed anxiety and depressed mood: Secondary | ICD-10-CM | POA: Diagnosis not present

## 2017-02-28 ENCOUNTER — Ambulatory Visit (INDEPENDENT_AMBULATORY_CARE_PROVIDER_SITE_OTHER): Payer: 59 | Admitting: Psychology

## 2017-02-28 DIAGNOSIS — F4323 Adjustment disorder with mixed anxiety and depressed mood: Secondary | ICD-10-CM

## 2017-03-14 ENCOUNTER — Ambulatory Visit (INDEPENDENT_AMBULATORY_CARE_PROVIDER_SITE_OTHER): Payer: 59 | Admitting: Psychology

## 2017-03-14 DIAGNOSIS — F4323 Adjustment disorder with mixed anxiety and depressed mood: Secondary | ICD-10-CM

## 2017-03-28 ENCOUNTER — Other Ambulatory Visit: Payer: Self-pay | Admitting: Family Medicine

## 2017-03-28 ENCOUNTER — Telehealth: Payer: Self-pay | Admitting: Family Medicine

## 2017-03-28 ENCOUNTER — Ambulatory Visit: Payer: Self-pay | Admitting: Psychology

## 2017-03-28 DIAGNOSIS — Z111 Encounter for screening for respiratory tuberculosis: Secondary | ICD-10-CM

## 2017-03-28 NOTE — Telephone Encounter (Signed)
Order entered

## 2017-03-28 NOTE — Telephone Encounter (Signed)
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

## 2017-03-29 NOTE — Telephone Encounter (Signed)
Patient scheduled labs for 04/01/17

## 2017-03-29 NOTE — Telephone Encounter (Signed)
Left message for patient, orders she requested are in the system.

## 2017-04-01 ENCOUNTER — Other Ambulatory Visit (INDEPENDENT_AMBULATORY_CARE_PROVIDER_SITE_OTHER): Payer: 59

## 2017-04-01 DIAGNOSIS — Z111 Encounter for screening for respiratory tuberculosis: Secondary | ICD-10-CM | POA: Diagnosis not present

## 2017-04-03 LAB — QUANTIFERON TB GOLD ASSAY (BLOOD)
Mitogen-Nil: >10 IU/mL
QUANTIFERON(R)-TB GOLD: NEGATIVE
Quantiferon Nil Value: 0.04 IU/mL
Quantiferon Tb Ag Minus Nil Value: <0 IU/mL

## 2017-04-08 ENCOUNTER — Telehealth: Payer: Self-pay

## 2017-04-08 NOTE — Telephone Encounter (Signed)
Called and informed Pt that her TB test came back negative. Pt didn't answer. Left a VM stating results.

## 2017-04-08 NOTE — Telephone Encounter (Signed)
-----   Message from Ann Held, DO sent at 04/03/2017  3:52 PM EDT ----- Neg tb

## 2017-04-09 ENCOUNTER — Ambulatory Visit (INDEPENDENT_AMBULATORY_CARE_PROVIDER_SITE_OTHER): Payer: 59 | Admitting: Psychology

## 2017-04-09 DIAGNOSIS — F4323 Adjustment disorder with mixed anxiety and depressed mood: Secondary | ICD-10-CM

## 2017-04-23 ENCOUNTER — Ambulatory Visit: Payer: 59 | Admitting: Psychology

## 2017-07-24 DIAGNOSIS — L239 Allergic contact dermatitis, unspecified cause: Secondary | ICD-10-CM | POA: Diagnosis not present

## 2017-08-14 ENCOUNTER — Ambulatory Visit (INDEPENDENT_AMBULATORY_CARE_PROVIDER_SITE_OTHER): Payer: 59 | Admitting: Psychology

## 2017-08-14 DIAGNOSIS — F4323 Adjustment disorder with mixed anxiety and depressed mood: Secondary | ICD-10-CM | POA: Diagnosis not present

## 2017-09-09 DIAGNOSIS — L2389 Allergic contact dermatitis due to other agents: Secondary | ICD-10-CM | POA: Diagnosis not present

## 2017-09-11 ENCOUNTER — Ambulatory Visit (INDEPENDENT_AMBULATORY_CARE_PROVIDER_SITE_OTHER): Payer: 59 | Admitting: Psychology

## 2017-09-11 DIAGNOSIS — L2389 Allergic contact dermatitis due to other agents: Secondary | ICD-10-CM | POA: Diagnosis not present

## 2017-09-11 DIAGNOSIS — F4323 Adjustment disorder with mixed anxiety and depressed mood: Secondary | ICD-10-CM | POA: Diagnosis not present

## 2017-09-25 ENCOUNTER — Ambulatory Visit (INDEPENDENT_AMBULATORY_CARE_PROVIDER_SITE_OTHER): Payer: 59 | Admitting: Psychology

## 2017-09-25 DIAGNOSIS — F4323 Adjustment disorder with mixed anxiety and depressed mood: Secondary | ICD-10-CM

## 2017-10-16 ENCOUNTER — Ambulatory Visit (INDEPENDENT_AMBULATORY_CARE_PROVIDER_SITE_OTHER): Payer: 59 | Admitting: Psychology

## 2017-10-16 DIAGNOSIS — F4323 Adjustment disorder with mixed anxiety and depressed mood: Secondary | ICD-10-CM | POA: Diagnosis not present

## 2017-11-04 ENCOUNTER — Encounter: Payer: Self-pay | Admitting: Family Medicine

## 2017-11-04 ENCOUNTER — Ambulatory Visit (INDEPENDENT_AMBULATORY_CARE_PROVIDER_SITE_OTHER): Payer: 59 | Admitting: Family Medicine

## 2017-11-04 VITALS — BP 118/72 | HR 73 | Temp 98.7°F | Resp 16 | Ht 68.4 in | Wt 157.6 lb

## 2017-11-04 DIAGNOSIS — H538 Other visual disturbances: Secondary | ICD-10-CM

## 2017-11-04 DIAGNOSIS — E538 Deficiency of other specified B group vitamins: Secondary | ICD-10-CM

## 2017-11-04 DIAGNOSIS — R5383 Other fatigue: Secondary | ICD-10-CM

## 2017-11-04 DIAGNOSIS — Z0000 Encounter for general adult medical examination without abnormal findings: Secondary | ICD-10-CM | POA: Diagnosis not present

## 2017-11-04 DIAGNOSIS — Z23 Encounter for immunization: Secondary | ICD-10-CM | POA: Diagnosis not present

## 2017-11-04 LAB — CBC WITH DIFFERENTIAL/PLATELET
Basophils Absolute: 0 10*3/uL (ref 0.0–0.1)
Basophils Relative: 0.4 % (ref 0.0–3.0)
EOS PCT: 2.2 % (ref 0.0–5.0)
Eosinophils Absolute: 0.1 10*3/uL (ref 0.0–0.7)
HCT: 42.6 % (ref 36.0–46.0)
HEMOGLOBIN: 14.1 g/dL (ref 12.0–15.0)
Lymphocytes Relative: 25.3 % (ref 12.0–46.0)
Lymphs Abs: 1.5 10*3/uL (ref 0.7–4.0)
MCHC: 33.1 g/dL (ref 30.0–36.0)
MCV: 91.8 fl (ref 78.0–100.0)
MONO ABS: 0.5 10*3/uL (ref 0.1–1.0)
Monocytes Relative: 7.8 % (ref 3.0–12.0)
Neutro Abs: 3.9 10*3/uL (ref 1.4–7.7)
Neutrophils Relative %: 64.3 % (ref 43.0–77.0)
Platelets: 226 10*3/uL (ref 150.0–400.0)
RBC: 4.64 Mil/uL (ref 3.87–5.11)
RDW: 13 % (ref 11.5–15.5)
WBC: 6 10*3/uL (ref 4.0–10.5)

## 2017-11-04 LAB — COMPREHENSIVE METABOLIC PANEL
ALBUMIN: 4 g/dL (ref 3.5–5.2)
ALK PHOS: 54 U/L (ref 39–117)
ALT: 7 U/L (ref 0–35)
AST: 14 U/L (ref 0–37)
BILIRUBIN TOTAL: 0.8 mg/dL (ref 0.2–1.2)
BUN: 16 mg/dL (ref 6–23)
CO2: 29 mEq/L (ref 19–32)
CREATININE: 0.74 mg/dL (ref 0.40–1.20)
Calcium: 9.4 mg/dL (ref 8.4–10.5)
Chloride: 105 mEq/L (ref 96–112)
GFR: 91.54 mL/min (ref >60.00)
Glucose, Bld: 91 mg/dL (ref 70–99)
POTASSIUM: 4.7 meq/L (ref 3.5–5.1)
Sodium: 141 mEq/L (ref 135–145)
TOTAL PROTEIN: 7.3 g/dL (ref 6.0–8.3)

## 2017-11-04 LAB — LIPID PANEL
CHOLESTEROL: 179 mg/dL (ref 0–200)
HDL: 46.1 mg/dL (ref >39.00)
LDL Cholesterol: 120 mg/dL — ABNORMAL HIGH (ref 0–99)
NonHDL: 132.9
Total CHOL/HDL Ratio: 4
Triglycerides: 63 mg/dL (ref 0.0–149.0)
VLDL: 12.6 mg/dL (ref 0.0–40.0)

## 2017-11-04 LAB — VITAMIN B12: Vitamin B-12: 238 pg/mL (ref 211–911)

## 2017-11-04 LAB — TSH: TSH: 1.8 u[IU]/mL (ref 0.35–4.50)

## 2017-11-04 NOTE — Progress Notes (Addendum)
Subjective:     Lydia Davis is a 42 y.o. female and is here for a comprehensive physical exam. The patient reports no problems.  Pt is in counseling and exercising which has helped her mood.    Social History   Socioeconomic History  . Marital status: Married    Spouse name: Not on file  . Number of children: Not on file  . Years of education: Not on file  . Highest education level: Not on file  Occupational History  . 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 Financial resource strain: Not on file  . Food insecurity:    Worry: Not on file    Inability: Not on file  . Transportation needs:    Medical: Not on file    Non-medical: Not on file  Tobacco Use  . Smoking status: Never Smoker  . Smokeless tobacco: Never Used  Substance and Sexual Activity  . Alcohol use: Yes    Alcohol/week: 0.0 oz    Comment: occasionally---glass of wine  . Drug use: No  . Sexual activity: Yes    Partners: Male    Birth control/protection: Pill  Lifestyle  . Physical activity:    Days per week: Not on file    Minutes per session: Not on file  . Stress: Not on file  Relationships  . Social connections:    Talks on phone: Not on file    Gets together: Not on file    Attends religious service: Not on file    Active member of club or organization: Not on file    Attends meetings of clubs or organizations: Not on file    Relationship status: Not on file  . Intimate partner violence:    Fear of current or ex partner: Not on file    Emotionally abused: Not on file    Physically abused: Not on file    Forced sexual activity: Not on file  Other Topics Concern  . Not on file  Social History Narrative   Exercise--Walking   Health Maintenance  Topic Date Due  . Samul Dada  06/17/2017  . HIV Screening  11/05/2023 (Originally 12/28/1990)  . INFLUENZA VACCINE  02/06/2018  . PAP SMEAR  01/07/2019    The following portions of the  patient's history were reviewed and updated as appropriate:  She  has a past medical history of History of TB skin testing. She does not have any pertinent problems on file. She  has a past surgical history that includes Cesarean section (2012) and Tonsillectomy (1994). Her family history includes Diabetes in her maternal grandfather; Heart disease in her unknown relative; Hyperlipidemia in her paternal grandfather; Hypertension in her father. She  reports that she has never smoked. She has never used smokeless tobacco. She reports that she drinks alcohol. She reports that she does not use drugs. She has a current medication list which includes the following prescription(s): crisaborole, levonorgestrel, and multivitamin. Current Outpatient Medications on File Prior to Visit  Medication Sig Dispense Refill  . Crisaborole (EUCRISA) 2 % OINT Apply topically.    Marland Kitchen levonorgestrel (MIRENA) 20 MCG/24HR IUD 1 each by Intrauterine route once.    . Multiple Vitamin (MULTIVITAMIN) tablet Take 1 tablet by mouth daily.     No current facility-administered medications on file prior to visit.    She is allergic to augmentin [amoxicillin-pot clavulanate] and lexapro [escitalopram oxalate]..  Review of Systems  Review of Systems  Constitutional: Negative for activity change, appetite change and fatigue.  HENT: Negative for hearing loss, congestion, tinnitus and ear discharge.  dentist q65m Eyes: Negative for visual disturbance (see optho q1y -- vision corrected to 20/20 with glasses).  Respiratory: Negative for cough, chest tightness and shortness of breath.   Cardiovascular: Negative for chest pain, palpitations and leg swelling.  Gastrointestinal: Negative for abdominal pain, diarrhea, constipation and abdominal distention.  Genitourinary: Negative for urgency, frequency, decreased urine volume and difficulty urinating.  Musculoskeletal: Negative for back pain, arthralgias and gait problem.  Skin: Negative  for color change, pallor and rash.  Neurological: Negative for dizziness, light-headedness, numbness and headaches.  Hematological: Negative for adenopathy. Does not bruise/bleed easily.  Psychiatric/Behavioral: Negative for suicidal ideas, confusion, sleep disturbance, self-injury, dysphoric mood, decreased concentration and agitation.       Objective:    BP 118/72 (BP Location: Right Arm, Cuff Size: Normal)   Pulse 73   Temp 98.7 F (37.1 C) (Oral)   Resp 16   Ht 5' 8.4" (1.737 m)   Wt 157 lb 9.6 oz (71.5 kg)   SpO2 98%   BMI 23.68 kg/m    General appearance: alert, cooperative, appears stated age and no distress Head: Normocephalic, without obvious abnormality, atraumatic Eyes: negative findings: lids and lashes normal, conjunctivae and sclerae normal and pupils equal, round, reactive to light and accomodation Ears: normal TM's and external ear canals both ears Nose: Nares normal. Septum midline. Mucosa normal. No drainage or sinus tenderness. Throat: lips, mucosa, and tongue normal; teeth and gums normal Neck: no adenopathy, no carotid bruit, no JVD, supple, symmetrical, trachea midline and thyroid not enlarged, symmetric, no tenderness/mass/nodules Back: symmetric, no curvature. ROM normal. No CVA tenderness. Lungs: clear to auscultation bilaterally Breasts: gyn   Heart: regular rate and rhythm, S1, S2 normal, no murmur, click, rub or gallop Abdomen: soft, non-tender; bowel sounds normal; no masses,  no organomegaly Pelvic: deferred Extremities: extremities normal, atraumatic, no cyanosis or edema Pulses: 2+ and symmetric Skin: Skin color, texture, turgor normal. No rashes or lesions Lymph nodes: Cervical, supraclavicular, and axillary nodes normal. Neurologic: Alert and oriented X 3, normal strength and tone. Normal symmetric reflexes. Normal coordination and gait   Assessment:    Healthy female exam.      Plan:    ghm utd Check labs tdap given today  1.  Preventative health care See above - CBC with Differential/Platelet - Comprehensive metabolic panel - Lipid panel - TSH  2. Blurry vision, bilateral Vision 20/20  - Ambulatory referral to Ophthalmology  3. Fatigue, unspecified type   - Vitamin D 1,25 dihydroxy  4. Vitamin B12 deficiency Recheck labs  - Vitamin B12  5. Need for diphtheria-tetanus-pertussis (Tdap) vaccine   - Tdap vaccine greater than or equal to 7yo IM  See After Visit Summary for Counseling Recommendations

## 2017-11-04 NOTE — Patient Instructions (Signed)
Preventive Care 40-64 Years, Female Preventive care refers to lifestyle choices and visits with your health care provider that can promote health and wellness. What does preventive care include?  A yearly physical exam. This is also called an annual well check.  Dental exams once or twice a year.  Routine eye exams. Ask your health care provider how often you should have your eyes checked.  Personal lifestyle choices, including: ? Daily care of your teeth and gums. ? Regular physical activity. ? Eating a healthy diet. ? Avoiding tobacco and drug use. ? Limiting alcohol use. ? Practicing safe sex. ? Taking low-dose aspirin daily starting at age 58. ? Taking vitamin and mineral supplements as recommended by your health care provider. What happens during an annual well check? The services and screenings done by your health care provider during your annual well check will depend on your age, overall health, lifestyle risk factors, and family history of disease. Counseling Your health care provider may ask you questions about your:  Alcohol use.  Tobacco use.  Drug use.  Emotional well-being.  Home and relationship well-being.  Sexual activity.  Eating habits.  Work and work Statistician.  Method of birth control.  Menstrual cycle.  Pregnancy history.  Screening You may have the following tests or measurements:  Height, weight, and BMI.  Blood pressure.  Lipid and cholesterol levels. These may be checked every 5 years, or more frequently if you are over 81 years old.  Skin check.  Lung cancer screening. You may have this screening every year starting at age 78 if you have a 30-pack-year history of smoking and currently smoke or have quit within the past 15 years.  Fecal occult blood test (FOBT) of the stool. You may have this test every year starting at age 65.  Flexible sigmoidoscopy or colonoscopy. You may have a sigmoidoscopy every 5 years or a colonoscopy  every 10 years starting at age 30.  Hepatitis C blood test.  Hepatitis B blood test.  Sexually transmitted disease (STD) testing.  Diabetes screening. This is done by checking your blood sugar (glucose) after you have not eaten for a while (fasting). You may have this done every 1-3 years.  Mammogram. This may be done every 1-2 years. Talk to your health care provider about when you should start having regular mammograms. This may depend on whether you have a family history of breast cancer.  BRCA-related cancer screening. This may be done if you have a family history of breast, ovarian, tubal, or peritoneal cancers.  Pelvic exam and Pap test. This may be done every 3 years starting at age 80. Starting at age 36, this may be done every 5 years if you have a Pap test in combination with an HPV test.  Bone density scan. This is done to screen for osteoporosis. You may have this scan if you are at high risk for osteoporosis.  Discuss your test results, treatment options, and if necessary, the need for more tests with your health care provider. Vaccines Your health care provider may recommend certain vaccines, such as:  Influenza vaccine. This is recommended every year.  Tetanus, diphtheria, and acellular pertussis (Tdap, Td) vaccine. You may need a Td booster every 10 years.  Varicella vaccine. You may need this if you have not been vaccinated.  Zoster vaccine. You may need this after age 5.  Measles, mumps, and rubella (MMR) vaccine. You may need at least one dose of MMR if you were born in  1957 or later. You may also need a second dose.  Pneumococcal 13-valent conjugate (PCV13) vaccine. You may need this if you have certain conditions and were not previously vaccinated.  Pneumococcal polysaccharide (PPSV23) vaccine. You may need one or two doses if you smoke cigarettes or if you have certain conditions.  Meningococcal vaccine. You may need this if you have certain  conditions.  Hepatitis A vaccine. You may need this if you have certain conditions or if you travel or work in places where you may be exposed to hepatitis A.  Hepatitis B vaccine. You may need this if you have certain conditions or if you travel or work in places where you may be exposed to hepatitis B.  Haemophilus influenzae type b (Hib) vaccine. You may need this if you have certain conditions.  Talk to your health care provider about which screenings and vaccines you need and how often you need them. This information is not intended to replace advice given to you by your health care provider. Make sure you discuss any questions you have with your health care provider. Document Released: 07/22/2015 Document Revised: 03/14/2016 Document Reviewed: 04/26/2015 Elsevier Interactive Patient Education  2018 Elsevier Inc.  

## 2017-11-06 ENCOUNTER — Ambulatory Visit: Payer: 59 | Admitting: Psychology

## 2017-11-07 LAB — VITAMIN D 1,25 DIHYDROXY
VITAMIN D 1, 25 (OH) TOTAL: 62 pg/mL (ref 18–72)
VITAMIN D2 1, 25 (OH): 8 pg/mL
Vitamin D3 1, 25 (OH)2: 54 pg/mL

## 2017-11-11 ENCOUNTER — Encounter: Payer: Self-pay | Admitting: Family Medicine

## 2017-11-13 ENCOUNTER — Other Ambulatory Visit: Payer: Self-pay | Admitting: *Deleted

## 2017-11-13 ENCOUNTER — Encounter: Payer: Self-pay | Admitting: *Deleted

## 2017-11-13 MED ORDER — "SYRINGE 25G X 5/8"" 3 ML MISC": 1 refills | Status: DC

## 2017-11-13 MED ORDER — CYANOCOBALAMIN 1000 MCG/ML IJ SOLN: 1000.0000 ug | INTRAMUSCULAR | 2 refills | Status: DC

## 2017-11-13 MED ORDER — CYANOCOBALAMIN 1000 MCG/ML IJ SOLN: INTRAMUSCULAR | 0 refills | Status: DC

## 2017-11-13 NOTE — Telephone Encounter (Signed)
She can do the monthly injections again ----  Recheck 3 months

## 2017-11-13 NOTE — Progress Notes (Signed)
cyanc

## 2017-12-08 ENCOUNTER — Other Ambulatory Visit: Payer: Self-pay | Admitting: Family Medicine

## 2017-12-10 ENCOUNTER — Ambulatory Visit (INDEPENDENT_AMBULATORY_CARE_PROVIDER_SITE_OTHER): Payer: 59 | Admitting: Psychology

## 2017-12-10 DIAGNOSIS — F4323 Adjustment disorder with mixed anxiety and depressed mood: Secondary | ICD-10-CM

## 2017-12-31 DIAGNOSIS — Z0100 Encounter for examination of eyes and vision without abnormal findings: Secondary | ICD-10-CM | POA: Diagnosis not present

## 2018-01-03 ENCOUNTER — Other Ambulatory Visit: Payer: Self-pay | Admitting: Family Medicine

## 2018-01-17 ENCOUNTER — Other Ambulatory Visit: Payer: Self-pay | Admitting: Family Medicine

## 2018-01-24 ENCOUNTER — Ambulatory Visit: Payer: 59 | Admitting: Psychology

## 2018-02-06 ENCOUNTER — Encounter: Payer: Self-pay | Admitting: Family Medicine

## 2018-02-07 ENCOUNTER — Encounter: Payer: Self-pay | Admitting: Family Medicine

## 2018-02-07 DIAGNOSIS — E538 Deficiency of other specified B group vitamins: Secondary | ICD-10-CM

## 2018-02-10 ENCOUNTER — Ambulatory Visit: Payer: 59 | Admitting: Family Medicine

## 2018-02-11 NOTE — Telephone Encounter (Signed)
Yes -- b12 can be repeated

## 2018-02-11 NOTE — Telephone Encounter (Signed)
b12 last checked 11/04/17.  Do you want to go ahead and get her scheduled for lab only to recheck?

## 2018-04-07 ENCOUNTER — Telehealth: Payer: Self-pay | Admitting: Family Medicine

## 2018-04-07 NOTE — Telephone Encounter (Signed)
Copied from [place] [id number]. Topic: Quick Communication - See Telephone Encounter >> Apr 07, 2018  4:03 PM Lydia Davis R wrote: Pt is calling in requesting to schedule her B12 shot.

## 2018-04-07 NOTE — Telephone Encounter (Signed)
Okay to schedule B12 injection.

## 2018-04-08 ENCOUNTER — Other Ambulatory Visit: Payer: Self-pay | Admitting: *Deleted

## 2018-04-08 DIAGNOSIS — E538 Deficiency of other specified B group vitamins: Secondary | ICD-10-CM

## 2018-04-08 NOTE — Telephone Encounter (Signed)
Called pt and attempted to schedule a nurse visit for a b12 inj. Pt stated that she has been self administering the b12 shots and she had requested follow up lab work.  Please advise

## 2018-04-08 NOTE — Telephone Encounter (Signed)
Future order placed.  Can you call to just schedule lab only appt.

## 2018-04-08 NOTE — Telephone Encounter (Signed)
Called pt and LVM informing her that lab orders were put in. Advised pt to call back and schedule a lab appt at her convenience.

## 2018-04-10 ENCOUNTER — Other Ambulatory Visit (INDEPENDENT_AMBULATORY_CARE_PROVIDER_SITE_OTHER): Payer: 59

## 2018-04-10 DIAGNOSIS — E538 Deficiency of other specified B group vitamins: Secondary | ICD-10-CM | POA: Diagnosis not present

## 2018-04-10 LAB — VITAMIN B12: Vitamin B-12: 785 pg/mL (ref 211–911)

## 2018-04-14 ENCOUNTER — Encounter: Payer: Self-pay | Admitting: Family Medicine

## 2018-04-14 NOTE — Telephone Encounter (Signed)
See labs 

## 2018-05-01 DIAGNOSIS — Z6823 Body mass index (BMI) 23.0-23.9, adult: Secondary | ICD-10-CM | POA: Diagnosis not present

## 2018-05-01 DIAGNOSIS — Z1231 Encounter for screening mammogram for malignant neoplasm of breast: Secondary | ICD-10-CM | POA: Diagnosis not present

## 2018-05-01 DIAGNOSIS — Z01419 Encounter for gynecological examination (general) (routine) without abnormal findings: Secondary | ICD-10-CM | POA: Diagnosis not present

## 2018-05-26 DIAGNOSIS — H6591 Unspecified nonsuppurative otitis media, right ear: Secondary | ICD-10-CM | POA: Diagnosis not present

## 2018-05-26 DIAGNOSIS — R42 Dizziness and giddiness: Secondary | ICD-10-CM | POA: Diagnosis not present

## 2018-06-30 ENCOUNTER — Encounter: Payer: Self-pay | Admitting: Family Medicine

## 2018-06-30 ENCOUNTER — Emergency Department (INDEPENDENT_AMBULATORY_CARE_PROVIDER_SITE_OTHER)
Admission: EM | Admit: 2018-06-30 | Discharge: 2018-06-30 | Disposition: A | Discharge status: Home / Self Care | Payer: 59 | Source: Home / Self Care

## 2018-06-30 ENCOUNTER — Other Ambulatory Visit: Payer: Self-pay

## 2018-06-30 DIAGNOSIS — H6983 Other specified disorders of Eustachian tube, bilateral: Secondary | ICD-10-CM

## 2018-06-30 HISTORY — DX: Dermatitis, unspecified: L30.9

## 2018-06-30 MED ORDER — IPRATROPIUM BROMIDE 0.03 % NA SOLN: 2.0000 | Freq: Two times a day (BID) | NASAL | 0 refills | Status: DC

## 2018-06-30 NOTE — ED Provider Notes (Signed)
[facility]    CSN: [id number] Arrival date & time: 06/30/18  1847     History   Chief Complaint No chief complaint on file.   HPI Lydia Davis is a 42 y.o. female.   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  She is been given a prescription for prednisone and Flonase along with an antihistamine but she continues to have muffled hearing, mild disequilibrium, and fullness in her ears.  She is also had some intermittent and mild tinnitus.  Patient also notes some persistent postnasal drip.     Past Medical History:  Diagnosis Date  . 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 AD (atopic dermatitis) 02/09/2015  . Preventative health care 06/17/2014    Past Surgical History:  Procedure Laterality Date  . CESAREAN SECTION  2012  . TONSILLECTOMY  1994    OB History   No obstetric history on file.      Home Medications    Prior to Admission medications   Medication Sig Start Date End Date Taking? Authorizing Provider  Crisaborole (EUCRISA) 2 % OINT Apply topically.    [provider]  cyanocobalamin (,VITAMIN B-12,) 1000 MCG/ML injection Inject 1 mL (1,000 mcg total) into the muscle every 30 (thirty) days. 11/13/17   Roma Schanz R, DO  cyanocobalamin (,VITAMIN B-12,) 1000 MCG/ML injection INJECT 1ML SUBCUTANEOUSLY ONCE WEEKLY FOR 4 WEEKS. 01/20/18   Ann Held, DO  levonorgestrel (MIRENA) 20 MCG/24HR IUD 1 each by Intrauterine route once.    [provider]  Multiple Vitamin (MULTIVITAMIN) tablet Take 1 tablet by mouth daily.    [provider]  Syringe/Needle, Disp, (SYRINGE 3CC/25GX5/8") 25G X 5/8" 3 ML MISC Use as directed with B-12 injection 11/13/17   Ann Held, DO    Family History Family History  Problem Relation Age of Onset  . Hypertension Father        and both  sets of grandparents  . Hyperlipidemia Paternal Grandfather   . Heart disease Unknown        both sets of grandparents  . Diabetes Maternal Grandfather     Social History Social History   Tobacco Use  . Smoking status: Never Smoker  . Smokeless tobacco: Never Used  Substance Use Topics  . Alcohol use: Yes    Alcohol/week: 0.0 standard drinks    Comment: occasionally---glass of wine  . Drug use: No     Allergies   Augmentin [amoxicillin-pot clavulanate] and Lexapro [escitalopram oxalate]   Review of Systems Review of Systems  Constitutional: Negative for fever.     Physical Exam Triage Vital Signs ED Triage Vitals  Enc Vitals Group     BP      Pulse      Resp      Temp      Temp src      SpO2      Weight      Height      Head Circumference      Peak Flow      Pain Score      Pain Loc      Pain Edu?      Excl. in [place]?    No data found.  Updated Vital Signs There were no vitals taken for this visit.   Physical  Exam Vitals signs and nursing note reviewed.  Constitutional:      Appearance: Normal appearance. She is normal weight.  HENT:     Head: Normocephalic.     Right Ear: Tympanic membrane, ear canal and external ear normal.     Left Ear: Tympanic membrane, ear canal and external ear normal.     Nose: Nose normal.  Pulmonary:     Effort: Pulmonary effort is normal.  Musculoskeletal: Normal range of motion.  Skin:    General: Skin is warm.  Neurological:     General: No focal deficit present.     Mental Status: She is alert.  Psychiatric:        Mood and Affect: Mood normal.      UC Treatments / Results  Labs (all labs ordered are listed, but only abnormal results are displayed) Labs Reviewed - No data to display  EKG None  Radiology No results found.  Procedures Procedures (including critical care time)  Medications Ordered in UC Medications - No data to display  Initial Impression / Assessment and Plan / UC Course  I have  reviewed the triage vital signs and the nursing notes.  Pertinent labs & imaging results that were available during my care of the patient were reviewed by me and considered in my medical decision making (see chart for details).    Final Clinical Impressions(s) / UC Diagnoses   Final diagnoses:  None   Discharge Instructions   None    ED Prescriptions    None     Controlled Substance Prescriptions [place] Controlled Substance Registry consulted? Not Applicable   Robyn Haber, MD 06/30/18 [phone]

## 2018-06-30 NOTE — ED Triage Notes (Signed)
Patient reports being treated for ear fullness and dizziness in a UC about 3 weeks ago; took 5 days of prednisone and antihistamine; improved. Three days ago she began feeling the same fullness in her right ear with intermittent dizziness and mild nausea; constant PND.

## 2018-06-30 NOTE — Discharge Instructions (Addendum)
Stop the other medications that you have been taking.  Clearly the steroid nasal spray is not working and antihistamines tend to thicken mucus and make the problem worse.

## 2018-07-04 ENCOUNTER — Emergency Department (HOSPITAL_BASED_OUTPATIENT_CLINIC_OR_DEPARTMENT_OTHER): Payer: 59

## 2018-07-04 ENCOUNTER — Emergency Department (HOSPITAL_BASED_OUTPATIENT_CLINIC_OR_DEPARTMENT_OTHER)
Admission: EM | Admit: 2018-07-04 | Discharge: 2018-07-04 | Disposition: A | Discharge status: Home / Self Care | Payer: 59 | Attending: Emergency Medicine | Admitting: Emergency Medicine

## 2018-07-04 ENCOUNTER — Encounter (HOSPITAL_BASED_OUTPATIENT_CLINIC_OR_DEPARTMENT_OTHER): Payer: Self-pay | Admitting: Emergency Medicine

## 2018-07-04 ENCOUNTER — Other Ambulatory Visit: Payer: Self-pay

## 2018-07-04 DIAGNOSIS — H938X1 Other specified disorders of right ear: Secondary | ICD-10-CM | POA: Diagnosis not present

## 2018-07-04 DIAGNOSIS — R05 Cough: Secondary | ICD-10-CM | POA: Diagnosis not present

## 2018-07-04 DIAGNOSIS — R0981 Nasal congestion: Secondary | ICD-10-CM | POA: Insufficient documentation

## 2018-07-04 DIAGNOSIS — Z79899 Other long term (current) drug therapy: Secondary | ICD-10-CM | POA: Diagnosis not present

## 2018-07-04 DIAGNOSIS — R0789 Other chest pain: Secondary | ICD-10-CM | POA: Insufficient documentation

## 2018-07-04 DIAGNOSIS — R079 Chest pain, unspecified: Secondary | ICD-10-CM | POA: Diagnosis not present

## 2018-07-04 DIAGNOSIS — R0602 Shortness of breath: Secondary | ICD-10-CM | POA: Insufficient documentation

## 2018-07-04 DIAGNOSIS — R911 Solitary pulmonary nodule: Secondary | ICD-10-CM | POA: Diagnosis not present

## 2018-07-04 DIAGNOSIS — R059 Cough, unspecified: Secondary | ICD-10-CM

## 2018-07-04 LAB — CBC WITH DIFFERENTIAL/PLATELET
Abs Immature Granulocytes: 0.01 10*3/uL (ref 0.00–0.07)
Basophils Absolute: 0 10*3/uL (ref 0.0–0.1)
Basophils Relative: 0 %
Eosinophils Absolute: 0 10*3/uL (ref 0.0–0.5)
Eosinophils Relative: 0 %
HEMATOCRIT: 43.6 % (ref 36.0–46.0)
Hemoglobin: 13.9 g/dL (ref 12.0–15.0)
Immature Granulocytes: 0 %
LYMPHS ABS: 1 10*3/uL (ref 0.7–4.0)
Lymphocytes Relative: 20 %
MCH: 28.7 pg (ref 26.0–34.0)
MCHC: 31.9 g/dL (ref 30.0–36.0)
MCV: 90.1 fL (ref 80.0–100.0)
MONO ABS: 0.3 10*3/uL (ref 0.1–1.0)
Monocytes Relative: 6 %
Neutro Abs: 3.7 10*3/uL (ref 1.7–7.7)
Neutrophils Relative %: 74 %
Platelets: 228 10*3/uL (ref 150–400)
RBC: 4.84 MIL/uL (ref 3.87–5.11)
RDW: 11.8 % (ref 11.5–15.5)
WBC: 5 10*3/uL (ref 4.0–10.5)
nRBC: 0 % (ref 0.0–0.2)

## 2018-07-04 LAB — BASIC METABOLIC PANEL
Anion gap: 6 (ref 5–15)
BUN: 12 mg/dL (ref 6–20)
CO2: 24 mmol/L (ref 22–32)
CREATININE: 0.68 mg/dL (ref 0.44–1.00)
Calcium: 8.9 mg/dL (ref 8.9–10.3)
Chloride: 106 mmol/L (ref 98–111)
GFR calc Af Amer: >60 mL/min (ref >60)
GFR calc non Af Amer: >60 mL/min (ref >60)
Glucose, Bld: 99 mg/dL (ref 70–99)
Potassium: 4.1 mmol/L (ref 3.5–5.1)
Sodium: 136 mmol/L (ref 135–145)

## 2018-07-04 LAB — URINALYSIS, MICROSCOPIC (REFLEX)

## 2018-07-04 LAB — TROPONIN I: Troponin I: <0.03 ng/mL (ref <0.03)

## 2018-07-04 LAB — URINALYSIS, ROUTINE W REFLEX MICROSCOPIC
BILIRUBIN URINE: NEGATIVE
Glucose, UA: NEGATIVE mg/dL
KETONES UR: NEGATIVE mg/dL
Leukocytes, UA: NEGATIVE
Nitrite: NEGATIVE
PROTEIN: NEGATIVE mg/dL
Specific Gravity, Urine: 1.015 (ref 1.005–1.030)
pH: 7 (ref 5.0–8.0)

## 2018-07-04 LAB — D-DIMER, QUANTITATIVE: D-Dimer, Quant: 0.54 ug/mL-FEU — ABNORMAL HIGH (ref 0.00–0.50)

## 2018-07-04 LAB — PREGNANCY, URINE: Preg Test, Ur: NEGATIVE

## 2018-07-04 MED ORDER — IOPAMIDOL (ISOVUE-370) INJECTION 76%
100.0000 mL | Freq: Once | INTRAVENOUS | Status: AC | PRN
  Administered 2018-07-04: 70 mL via INTRAVENOUS

## 2018-07-04 MED ORDER — OXYMETAZOLINE HCL 0.05 % NA SOLN
2.0000 | Freq: Once | NASAL | Status: AC
  Administered 2018-07-04: 2 via NASAL
  Filled 2018-07-04: qty 15

## 2018-07-04 MED ORDER — BENZONATATE 100 MG PO CAPS: 100.0000 mg | ORAL_CAPSULE | Freq: Three times a day (TID) | ORAL | 0 refills | Status: DC

## 2018-07-04 NOTE — Discharge Instructions (Signed)
Your workup today was reassuring.   This included An EKG Blood work to look at your Troponin - an enzyme released from your heart during times of stress Chest xray CT of your chest - this did not show evidence of a blood clot.   Your CT scan showed a 6 x 5 mm nodular opacity superior segment right lower lobe. It also showed a scattered 1 mm nodular opacity is also present bilaterally. It is recommended you follow up with your primary care provider to have a non-contrast chest CT in 6-12 months. If the nodule is stable at time of repeat CT, then future CT at 18-24 months (from today's scan) is considered optional for low-risk patients, but is recommended for high-risk patients.   If you develop worsening or new concerning symptoms you can return to the emergency department for re-evaluation.

## 2018-07-04 NOTE — ED Triage Notes (Signed)
SOB all week.  Went to UC and got "Atrovent nasal spray".  SOB getting worse.  Dry cough.

## 2018-07-04 NOTE — ED Notes (Signed)
Patient c/o right ear feeling full, feeling bloated, "I think that my stomach is pushing my diaphragm".  I felt like having a cold for 4 days.

## 2018-07-04 NOTE — ED Provider Notes (Signed)
[facility] Provider Note   CSN: [id number] Arrival date & time: 07/04/18  9892     History   Chief Complaint Chief Complaint  Patient presents with  . Shortness of Breath    HPI Lydia Davis is a 42 y.o. female with a history of eczema who presents to the ED today for nasal congestion, ear fullness, sinus pressure, cough, chest tightness and shortness of breath.   Patient reports that over the weekend, 6 days aog she began having nasal congestion, rhinorrhea, postnasal drip, sinus pressure and right-sided ear fullness.  She was taking Mucinex and antihistamines for symptoms with some relief but she reports it made her feel weird so she stopped taking them.  She went to urgent care and was given Atrovent nasal spray for her symptoms.  She reports that her right ear fullness has improved but her nasal congestion, rhinorrhea, postnasal drip and sinus pressure has worsened.  She reports that over the last 4 days she has developed a cough that is dry in nature.  She feels like it is brought on due to having to clear her throat from the postnasal drip.  She reports that over the last 2 days she has not felt short of breath and chest tightness.  She feels at times she is wheezing.  She reports has not been taking anything for her symptoms other than the previously prescribed Atrovent. The patient notes that she also has some body aches and chills.  This morning she feels like her abdomen is bloated pressing on her diaphragm.  She denies any associated fever, headache, neck stiffness, visual changes, presyncope, syncope, chest pain, hemoptysis, abdominal pain, nausea, vomiting, diarrhea, dysuria, flank pain, lower extremity swelling.  No recent surgery, recent travel, recent immobilization.  Patient is without history of cancer.  She denies history of PE/DVT.  She is a never smoker.  She denies history of COPD, CHF or asthma. Denies oral exogenous estrogen use (has IUD).     HPI  Past Medical History:  Diagnosis Date  . Eczema   . 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 AD (atopic dermatitis) 02/09/2015  . Preventative health care 06/17/2014    Past Surgical History:  Procedure Laterality Date  . CESAREAN SECTION  2012  . TONSILLECTOMY  1994     OB History   No obstetric history on file.      Home Medications    Prior to Admission medications   Medication Sig Start Date End Date Taking? Authorizing Provider  vitamin B-12 (CYANOCOBALAMIN) 500 MCG tablet Take 500 mcg by mouth daily.   Yes [provider]  ipratropium (ATROVENT) 0.03 % nasal spray Place 2 sprays into both nostrils 2 (two) times daily. 06/30/18   Robyn Haber, MD  levonorgestrel (MIRENA) 20 MCG/24HR IUD 1 each by Intrauterine route once.    [provider]  Multiple Vitamin (MULTIVITAMIN) tablet Take 1 tablet by mouth daily.    [provider]    Family History Family History  Problem Relation Age of Onset  . Hypertension Father        and both sets of grandparents  . Hyperlipidemia Paternal Grandfather   . Heart disease Other        both sets of grandparents  . Diabetes Maternal Grandfather     Social History Social History   Tobacco Use  . Smoking status: Never Smoker  .  Smokeless tobacco: Never Used  Substance Use Topics  . Alcohol use: Yes    Alcohol/week: 0.0 standard drinks    Comment: occasionally---glass of wine  . Drug use: No     Allergies   Augmentin [amoxicillin-pot clavulanate] and Lexapro [escitalopram oxalate]   Review of Systems Review of Systems  All other systems reviewed and are negative.    Physical Exam Updated Vital Signs BP (!) 149/96 (BP Location: Left Arm)   Pulse (!) 104   Temp 98.4 F (36.9 C) (Oral)   Resp 18   Ht 5\' 8"  (1.727 m)   Wt 68 kg   SpO2 100%   BMI 22.81 kg/m   Physical Exam Vitals signs and nursing note reviewed.   Constitutional:      Appearance: She is well-developed. She is not diaphoretic.  HENT:     Head: Normocephalic and atraumatic.     Right Ear: Tympanic membrane and external ear normal. No mastoid tenderness.     Left Ear: Tympanic membrane normal. No mastoid tenderness.     Ears:     Comments: No mastoid erythema, edema or tenderness.  No obliteration of the postauricular crease.    Nose: Mucosal edema and rhinorrhea present.     Right Sinus: Maxillary sinus tenderness present. No frontal sinus tenderness.     Left Sinus: Maxillary sinus tenderness present. No frontal sinus tenderness.     Mouth/Throat:     Pharynx: Uvula midline.     Tonsils: No tonsillar exudate.     Comments: The patient has normal phonation and is in control of secretions. No stridor.  Midline uvula without edema. Soft palate rises symmetrically.  Tonsils absent. No  erythema or exudates. No PTA. Tongue protrusion is normal. Cobblestoning. No trismus. No creptius on neck palpation and patient has goo there is always tach d dentition. No gingival erythema or fluctuance noted. Mucus membranes moist.  Eyes:     General: No scleral icterus.       Right eye: No discharge.        Left eye: No discharge.     Pupils: Pupils are equal, round, and reactive to light.  Neck:     Musculoskeletal: Neck supple. Normal range of motion. No neck rigidity or spinous process tenderness.     Trachea: Trachea normal.     Comments: .No nuchal rigidity or meningismus Cardiovascular:     Rate and Rhythm: Normal rate and regular rhythm.     Pulses:          Radial pulses are 2+ on the right side and 2+ on the left side.       Dorsalis pedis pulses are 2+ on the right side and 2+ on the left side.       Posterior tibial pulses are 2+ on the right side and 2+ on the left side.     Heart sounds: No murmur.     Comments: No lower extremity swelling or edema. Calves symmetric in size bilaterally. Pulmonary:     Effort: Pulmonary effort is  normal.     Breath sounds: Normal breath sounds.     Comments: No increased work of breathing. No accessory muscle use. Patient is sitting upright, speaking in full sentences without difficulty  Chest:     Chest wall: No tenderness.  Abdominal:     General: Bowel sounds are normal. There is no distension.     Palpations: Abdomen is soft.     Tenderness: There is no abdominal  tenderness. There is no right CVA tenderness, left CVA tenderness, guarding or rebound.  Lymphadenopathy:     Cervical: No cervical adenopathy.  Skin:    General: Skin is warm and dry.     Findings: No rash.  Neurological:     Mental Status: She is alert.      ED Treatments / Results  Labs (all labs ordered are listed, but only abnormal results are displayed) Labs Reviewed  URINALYSIS, ROUTINE W REFLEX MICROSCOPIC - Abnormal; Notable for the following components:      Result Value   Hgb urine dipstick TRACE (*)    All other components within normal limits  URINALYSIS, MICROSCOPIC (REFLEX) - Abnormal; Notable for the following components:   Bacteria, UA FEW (*)    All other components within normal limits  D-DIMER, QUANTITATIVE (NOT AT [facility]) - Abnormal; Notable for the following components:   D-Dimer, Quant 0.54 (*)    All other components within normal limits  PREGNANCY, URINE  BASIC METABOLIC PANEL  CBC WITH DIFFERENTIAL/PLATELET  TROPONIN I    EKG EKG Interpretation  Date/Time:  Friday July 04 2018 10:56:12 EST Ventricular Rate:  85 PR Interval:    QRS Duration: 103 QT Interval:  381 QTC Calculation: 453 R Axis:   75 Text Interpretation:  Sinus rhythm Normal ECG No previous tracing Confirmed by Blanchie Dessert [id number]) on 07/04/2018 11:52:38 AM   Radiology Dg Chest 2 View  Result Date: 07/04/2018 CLINICAL DATA:  Pt with sob x 3 days; no chest pain; no h/o lung problems; no fever; nonsmoker; dry cough EXAM: CHEST - 2 VIEW COMPARISON:  None. FINDINGS: Normal heart, mediastinum and  hila. Clear lungs.  No pleural effusion or pneumothorax. Skeletal structures are unremarkable. IMPRESSION: No active cardiopulmonary disease. Electronically Signed   By: Lajean Manes M.D.   On: 07/04/2018 10:52   Ct Angio Chest Pe W/cm &/or Wo Cm  Result Date: 07/04/2018 CLINICAL DATA:  Shortness of breath and chest tightness EXAM: CT ANGIOGRAPHY CHEST WITH CONTRAST TECHNIQUE: Multidetector CT imaging of the chest was performed using the standard protocol during bolus administration of intravenous contrast. Multiplanar CT image reconstructions and MIPs were obtained to evaluate the vascular anatomy. CONTRAST:  57mL ISOVUE-370 IOPAMIDOL (ISOVUE-370) INJECTION 76% COMPARISON:  Chest radiograph July 04, 2018 FINDINGS: Cardiovascular: There is no demonstrable pulmonary embolus. There is no thoracic aortic aneurysm or dissection. The visualized great vessels appear normal. Note that the right innominate and left common carotid arteries arise as a common trunk, an anatomic variant. There is no pericardial effusion or pericardial thickening. Mediastinum/Nodes: Visualized thyroid appears normal. There is no appreciable thoracic adenopathy. No esophageal lesions are evident. Lungs/Pleura: There is an area of apparent rounded atelectasis in the posterior segment right lower lobe. There is a nodular opacity abutting the pleura in the posterior aspect of the superior segment right lower lobe measuring 6 x 5 mm, best seen on axial slice 48 series 7. There is a 1 mm nodular opacity abutting the pleura in the anterior segment right upper lobe seen on axial slice 28 series 7. There is a 1 mm nodular opacity in the lateral segment of the left lower lobe seen on axial slice 67 series 7. No pleural effusion or pleural thickening evident. No edema or consolidation. Upper Abdomen: Visualized upper abdominal structures appear unremarkable. Musculoskeletal: There are no blastic or lytic bone lesions. There are no evident chest  wall lesions. Review of the MIP images confirms the above findings. IMPRESSION: 1. No demonstrable  pulmonary embolus. No thoracic aortic aneurysm or dissection. 2. 6 x 5 mm nodular opacity superior segment right lower lobe. Scattered 1 mm nodular opacity is also present bilaterally. Area of suspected rounded atelectasis posterior right base. No consolidation. Non-contrast chest CT at 6-12 months is recommended. If the nodule is stable at time of repeat CT, then future CT at 18-24 months (from today's scan) is considered optional for low-risk patients, but is recommended for high-risk patients. This recommendation follows the consensus statement: Guidelines for Management of Incidental Pulmonary Nodules Detected on CT Images: From the Fleischner Society 2017; Radiology 2017; 284:228-243. 3.  No evident thoracic adenopathy. Electronically Signed   By: Lowella Grip III M.D.   On: 07/04/2018 12:59    Procedures Procedures (including critical care time)  Medications Ordered in ED Medications  oxymetazoline (AFRIN) 0.05 % nasal spray 2 spray (2 sprays Each Nare Given 07/04/18 1058)  iopamidol (ISOVUE-370) 76 % injection 100 mL (70 mLs Intravenous Contrast Given 07/04/18 1243)     Initial Impression / Assessment and Plan / ED Course  I have reviewed the triage vital signs and the nursing notes.  Pertinent labs & imaging results that were available during my care of the patient were reviewed by me and considered in my medical decision making (see chart for details).    42 y.o. female presents to the ED today for nasal congestion, ear fullness, sinus pressure, cough, chest tightness and shortness of breath.  Patient's exam is reassuring as above.  No evidence of mastoiditis.  No meningeal signs.  Lungs are clear to auscultation bilaterally.  Abdomen is soft, nondistended and nontender.  No peritoneal signs.  Patient received thorough and reassuring work-up here. EKG NSR.  No ST-T changes.  Chest  x-ray unremarkable.  Troponin within normal limits.  Do not suspect ACS.  CBC without leukocytosis.  There is no anemia.  BMP unremarkable. Patient initially had some tachycardia on presentation and cannot be ruled out by [facility].  D-dimer was obtained and was elevated.  CTA was obtained and did not demonstrate PE, thoracic aortic aneurysm or dissection.  There is no evidence of infection, edema.  Patient is noted to have 6 x 5 mm nodule opacity in the right lower lobe as well as scattered 1 mm nodule opacity bilaterally.  I discussed this with the patient and had a conversation that she will need to follow-up with her primary care provider to have a repeat CT scan of the chest in 6-12 months for further evaluation.  I have included this in the patient's discharge instructions with recommendations per radiologist report.  I have also added pulmonary nodule to her problem list.  The evaluation does not show pathology that would require ongoing emergent intervention or inpatient treatment. I advised the patient to follow-up with PCP this week. I advised the patient to return to the emergency department with new or worsening symptoms or new concerns. Specific return precautions discussed. The patient verbalized understanding and agreement with plan. All questions answered. No further questions at this time. The patient is hemodynamically stable, mentating appropriately and appears safe for discharge.  Final Clinical Impressions(s) / ED Diagnoses   Final diagnoses:  Ear fullness, right  Nasal congestion  Shortness of breath  Cough  Pulmonary nodule    ED Discharge Orders         Ordered    benzonatate (TESSALON) 100 MG capsule  Every 8 hours     07/04/18 1328  Jillyn Ledger, PA-C 07/04/18 1531    Blanchie Dessert, MD 07/04/18 [phone]

## 2018-07-14 ENCOUNTER — Ambulatory Visit (INDEPENDENT_AMBULATORY_CARE_PROVIDER_SITE_OTHER): Payer: 59 | Admitting: Family Medicine

## 2018-07-14 ENCOUNTER — Encounter: Payer: Self-pay | Admitting: Family Medicine

## 2018-07-14 VITALS — BP 126/86 | HR 106 | Temp 98.6°F | Resp 16 | Ht 68.0 in | Wt 155.4 lb

## 2018-07-14 DIAGNOSIS — H6982 Other specified disorders of Eustachian tube, left ear: Secondary | ICD-10-CM

## 2018-07-14 NOTE — Progress Notes (Signed)
Patient ID: Lydia Davis, female    DOB: 24-May-1976  Age: 43 y.o. MRN: [id number]    Subjective:  Subjective  HPI Lydia Davis presents for er f/u for eustacian tube dysfunction.  Congestion has cleared up but she still feels pressure in her ear.  She also is periodically dizzy.  No other symptoms.     Review of Systems  Constitutional: Negative for appetite change, diaphoresis, fatigue and unexpected weight change.  HENT: Positive for ear pain.   Eyes: Negative for pain, redness and visual disturbance.  Respiratory: Negative for cough, chest tightness, shortness of breath and wheezing.   Cardiovascular: Negative for chest pain, palpitations and leg swelling.  Endocrine: Negative for cold intolerance, heat intolerance, polydipsia, polyphagia and polyuria.  Genitourinary: Negative for difficulty urinating, dysuria and frequency.  Neurological: Positive for dizziness. Negative for light-headedness, numbness and headaches.    History Past Medical History:  Diagnosis Date  . Eczema   . History of TB skin testing    postive    She has a past surgical history that includes Cesarean section (2012) and Tonsillectomy (1994).   Her family history includes Diabetes in her maternal grandfather; Heart disease in an other family member; Hyperlipidemia in her paternal grandfather; Hypertension in her father.She reports that she has never smoked. She has never used smokeless tobacco. She reports current alcohol use. She reports that she does not use drugs.  Current Outpatient Medications on File Prior to Visit  Medication Sig Dispense Refill  . levonorgestrel (MIRENA) 20 MCG/24HR IUD 1 each by Intrauterine route once.    . Multiple Vitamin (MULTIVITAMIN) tablet Take 1 tablet by mouth daily.    . vitamin B-12 (CYANOCOBALAMIN) 500 MCG tablet Take 500 mcg by mouth daily.     No current facility-administered medications on file prior to visit.      Objective:  Objective  Physical Exam Vitals  signs and nursing note reviewed.  Constitutional:      Appearance: She is well-developed.  HENT:     Head: Normocephalic and atraumatic.     Right Ear: Hearing, tympanic membrane, ear canal and external ear normal.     Left Ear: Tenderness present. A middle ear effusion is present. Tympanic membrane is retracted.  Eyes:     Conjunctiva/sclera: Conjunctivae normal.  Neck:     Musculoskeletal: Normal range of motion and neck supple.     Thyroid: No thyromegaly.     Vascular: No carotid bruit or JVD.  Cardiovascular:     Rate and Rhythm: Normal rate and regular rhythm.     Heart sounds: Normal heart sounds. No murmur.  Pulmonary:     Effort: Pulmonary effort is normal. No respiratory distress.     Breath sounds: Normal breath sounds. No wheezing or rales.  Chest:     Chest wall: No tenderness.  Neurological:     Mental Status: She is alert and oriented to person, place, and time.    BP 126/86 (BP Location: Left Arm, Cuff Size: Normal)   Pulse (!) 106   Temp 98.6 F (37 C) (Oral)   Resp 16   Ht 5\' 8"  (1.727 m)   Wt 155 lb 6.4 oz (70.5 kg)   SpO2 99%   BMI 23.63 kg/m  Wt Readings from Last 3 Encounters:  07/14/18 155 lb 6.4 oz (70.5 kg)  07/04/18 150 lb (68 kg)  06/30/18 150 lb (68 kg)     Lab Results  Component Value Date   WBC 5.0 07/04/2018  HGB 13.9 07/04/2018   HCT 43.6 07/04/2018   PLT 228 07/04/2018   GLUCOSE 99 07/04/2018   CHOL 179 11/04/2017   TRIG 63.0 11/04/2017   HDL 46.10 11/04/2017   LDLCALC 120 (H) 11/04/2017   ALT 7 11/04/2017   AST 14 11/04/2017   NA 136 07/04/2018   K 4.1 07/04/2018   CL 106 07/04/2018   CREATININE 0.68 07/04/2018   BUN 12 07/04/2018   CO2 24 07/04/2018   TSH 1.80 11/04/2017    Dg Chest 2 View  Result Date: 07/04/2018 CLINICAL DATA:  Pt with sob x 3 days; no chest pain; no h/o lung problems; no fever; nonsmoker; dry cough EXAM: CHEST - 2 VIEW COMPARISON:  None. FINDINGS: Normal heart, mediastinum and hila. Clear lungs.   No pleural effusion or pneumothorax. Skeletal structures are unremarkable. IMPRESSION: No active cardiopulmonary disease. Electronically Signed   By: Lajean Manes M.D.   On: 07/04/2018 10:52   Ct Angio Chest Pe W/cm &/or Wo Cm  Result Date: 07/04/2018 CLINICAL DATA:  Shortness of breath and chest tightness EXAM: CT ANGIOGRAPHY CHEST WITH CONTRAST TECHNIQUE: Multidetector CT imaging of the chest was performed using the standard protocol during bolus administration of intravenous contrast. Multiplanar CT image reconstructions and MIPs were obtained to evaluate the vascular anatomy. CONTRAST:  88mL ISOVUE-370 IOPAMIDOL (ISOVUE-370) INJECTION 76% COMPARISON:  Chest radiograph July 04, 2018 FINDINGS: Cardiovascular: There is no demonstrable pulmonary embolus. There is no thoracic aortic aneurysm or dissection. The visualized great vessels appear normal. Note that the right innominate and left common carotid arteries arise as a common trunk, an anatomic variant. There is no pericardial effusion or pericardial thickening. Mediastinum/Nodes: Visualized thyroid appears normal. There is no appreciable thoracic adenopathy. No esophageal lesions are evident. Lungs/Pleura: There is an area of apparent rounded atelectasis in the posterior segment right lower lobe. There is a nodular opacity abutting the pleura in the posterior aspect of the superior segment right lower lobe measuring 6 x 5 mm, best seen on axial slice 48 series 7. There is a 1 mm nodular opacity abutting the pleura in the anterior segment right upper lobe seen on axial slice 28 series 7. There is a 1 mm nodular opacity in the lateral segment of the left lower lobe seen on axial slice 67 series 7. No pleural effusion or pleural thickening evident. No edema or consolidation. Upper Abdomen: Visualized upper abdominal structures appear unremarkable. Musculoskeletal: There are no blastic or lytic bone lesions. There are no evident chest wall lesions. Review  of the MIP images confirms the above findings. IMPRESSION: 1. No demonstrable pulmonary embolus. No thoracic aortic aneurysm or dissection. 2. 6 x 5 mm nodular opacity superior segment right lower lobe. Scattered 1 mm nodular opacity is also present bilaterally. Area of suspected rounded atelectasis posterior right base. No consolidation. Non-contrast chest CT at 6-12 months is recommended. If the nodule is stable at time of repeat CT, then future CT at 18-24 months (from today's scan) is considered optional for low-risk patients, but is recommended for high-risk patients. This recommendation follows the consensus statement: Guidelines for Management of Incidental Pulmonary Nodules Detected on CT Images: From the Fleischner Society 2017; Radiology 2017; 284:228-243. 3.  No evident thoracic adenopathy. Electronically Signed   By: Lowella Grip III M.D.   On: 07/04/2018 12:59     Assessment & Plan:  Plan  I have discontinued Kaydan Dunsmore's ipratropium and benzonatate. I am also having her maintain her multivitamin, levonorgestrel, and vitamin B-12.  No orders of the defined types were placed in this encounter.   Problem List Items Addressed This Visit    None    Visit Diagnoses    Eustachian tube dysfunction, left    -  Primary   Relevant Orders   Ambulatory referral to ENT        Restart flonase and xyzal      Referral done for ENT since symptoms started several months ago  Follow-up: Return if symptoms worsen or fail to improve.  Ann Held, DO

## 2018-07-14 NOTE — Patient Instructions (Signed)

## 2018-07-22 ENCOUNTER — Encounter: Payer: Self-pay | Admitting: Family Medicine

## 2018-07-22 DIAGNOSIS — H93293 Other abnormal auditory perceptions, bilateral: Secondary | ICD-10-CM | POA: Diagnosis not present

## 2018-07-22 DIAGNOSIS — R42 Dizziness and giddiness: Secondary | ICD-10-CM | POA: Diagnosis not present

## 2018-07-22 DIAGNOSIS — H6983 Other specified disorders of Eustachian tube, bilateral: Secondary | ICD-10-CM | POA: Diagnosis not present

## 2018-07-31 ENCOUNTER — Ambulatory Visit (INDEPENDENT_AMBULATORY_CARE_PROVIDER_SITE_OTHER): Payer: 59 | Admitting: Family Medicine

## 2018-07-31 ENCOUNTER — Encounter: Payer: Self-pay | Admitting: Family Medicine

## 2018-07-31 VITALS — BP 106/78 | HR 81 | Temp 98.3°F | Resp 16 | Ht 68.0 in | Wt 156.2 lb

## 2018-07-31 DIAGNOSIS — F410 Panic disorder [episodic paroxysmal anxiety] without agoraphobia: Secondary | ICD-10-CM | POA: Diagnosis not present

## 2018-07-31 DIAGNOSIS — F419 Anxiety disorder, unspecified: Secondary | ICD-10-CM | POA: Diagnosis not present

## 2018-07-31 MED ORDER — ALPRAZOLAM 0.25 MG PO TABS: 0.2500 mg | ORAL_TABLET | Freq: Two times a day (BID) | ORAL | 0 refills | Status: DC | PRN

## 2018-07-31 MED ORDER — ESCITALOPRAM OXALATE 10 MG PO TABS: 10.0000 mg | ORAL_TABLET | Freq: Every day | ORAL | 2 refills | Status: DC

## 2018-07-31 NOTE — Patient Instructions (Signed)

## 2018-07-31 NOTE — Progress Notes (Signed)
Patient ID: Lydia Davis, female    DOB: 1975/07/30  Age: 43 y.o. MRN: [id number]    Subjective:  Subjective  HPI Lydia Davis presents for anxiety and panic attacks   She has had 2 deaths in the family and is under a lot of stress   Review of Systems  Constitutional: Negative for appetite change, diaphoresis, fatigue and unexpected weight change.  Eyes: Negative for pain, redness and visual disturbance.  Respiratory: Negative for cough, chest tightness, shortness of breath and wheezing.   Cardiovascular: Negative for chest pain, palpitations and leg swelling.  Endocrine: Negative for cold intolerance, heat intolerance, polydipsia, polyphagia and polyuria.  Genitourinary: Negative for difficulty urinating, dysuria and frequency.  Neurological: Negative for dizziness, light-headedness, numbness and headaches.  Psychiatric/Behavioral: Positive for sleep disturbance. Negative for decreased concentration, self-injury and suicidal ideas. The patient is nervous/anxious.     History Past Medical History:  Diagnosis Date  . Eczema   . History of TB skin testing    postive    She has a past surgical history that includes Cesarean section (2012) and Tonsillectomy (1994).   Her family history includes Diabetes in her maternal grandfather; Heart disease in an other family member; Hyperlipidemia in her paternal grandfather; Hypertension in her father.She reports that she has never smoked. She has never used smokeless tobacco. She reports current alcohol use. She reports that she does not use drugs.  Current Outpatient Medications on File Prior to Visit  Medication Sig Dispense Refill  . fluticasone (FLONASE) 50 MCG/ACT nasal spray Place into both nostrils daily.    . hydrOXYzine (ATARAX/VISTARIL) 10 MG tablet Take 10-20 mg by mouth as needed.    Marland Kitchen levonorgestrel (MIRENA) 20 MCG/24HR IUD 1 each by Intrauterine route once.    . Multiple Vitamin (MULTIVITAMIN) tablet Take 1 tablet by mouth  daily.    . vitamin B-12 (CYANOCOBALAMIN) 500 MCG tablet Take 500 mcg by mouth daily.     No current facility-administered medications on file prior to visit.      Objective:  Objective  Physical Exam Vitals signs and nursing note reviewed.  Constitutional:      Appearance: She is well-developed.  HENT:     Head: Normocephalic and atraumatic.  Eyes:     Conjunctiva/sclera: Conjunctivae normal.  Neck:     Musculoskeletal: Normal range of motion and neck supple.     Thyroid: No thyromegaly.     Vascular: No carotid bruit or JVD.  Cardiovascular:     Rate and Rhythm: Normal rate and regular rhythm.     Heart sounds: Normal heart sounds. No murmur.  Pulmonary:     Effort: Pulmonary effort is normal. No respiratory distress.     Breath sounds: Normal breath sounds. No wheezing or rales.  Chest:     Chest wall: No tenderness.  Neurological:     Mental Status: She is alert and oriented to person, place, and time.  Psychiatric:        Mood and Affect: Mood is anxious. Mood is not depressed. Affect is tearful.    BP 106/78 (BP Location: Right Arm, Cuff Size: Normal)   Pulse 81   Temp 98.3 F (36.8 C) (Oral)   Resp 16   Ht 5\' 8"  (1.727 m)   Wt 156 lb 3.2 oz (70.9 kg)   SpO2 99%   BMI 23.75 kg/m  Wt Readings from Last 3 Encounters:  07/31/18 156 lb 3.2 oz (70.9 kg)  07/14/18 155 lb 6.4 oz (70.5 kg)  07/04/18 150 lb (68 kg)     Lab Results  Component Value Date   WBC 5.0 07/04/2018   HGB 13.9 07/04/2018   HCT 43.6 07/04/2018   PLT 228 07/04/2018   GLUCOSE 99 07/04/2018   CHOL 179 11/04/2017   TRIG 63.0 11/04/2017   HDL 46.10 11/04/2017   LDLCALC 120 (H) 11/04/2017   ALT 7 11/04/2017   AST 14 11/04/2017   NA 136 07/04/2018   K 4.1 07/04/2018   CL 106 07/04/2018   CREATININE 0.68 07/04/2018   BUN 12 07/04/2018   CO2 24 07/04/2018   TSH 1.80 11/04/2017    Dg Chest 2 View  Result Date: 07/04/2018 CLINICAL DATA:  Pt with sob x 3 days; no chest pain; no h/o  lung problems; no fever; nonsmoker; dry cough EXAM: CHEST - 2 VIEW COMPARISON:  None. FINDINGS: Normal heart, mediastinum and hila. Clear lungs.  No pleural effusion or pneumothorax. Skeletal structures are unremarkable. IMPRESSION: No active cardiopulmonary disease. Electronically Signed   By: Lajean Manes M.D.   On: 07/04/2018 10:52   Ct Angio Chest Pe W/cm &/or Wo Cm  Result Date: 07/04/2018 CLINICAL DATA:  Shortness of breath and chest tightness EXAM: CT ANGIOGRAPHY CHEST WITH CONTRAST TECHNIQUE: Multidetector CT imaging of the chest was performed using the standard protocol during bolus administration of intravenous contrast. Multiplanar CT image reconstructions and MIPs were obtained to evaluate the vascular anatomy. CONTRAST:  91mL ISOVUE-370 IOPAMIDOL (ISOVUE-370) INJECTION 76% COMPARISON:  Chest radiograph July 04, 2018 FINDINGS: Cardiovascular: There is no demonstrable pulmonary embolus. There is no thoracic aortic aneurysm or dissection. The visualized great vessels appear normal. Note that the right innominate and left common carotid arteries arise as a common trunk, an anatomic variant. There is no pericardial effusion or pericardial thickening. Mediastinum/Nodes: Visualized thyroid appears normal. There is no appreciable thoracic adenopathy. No esophageal lesions are evident. Lungs/Pleura: There is an area of apparent rounded atelectasis in the posterior segment right lower lobe. There is a nodular opacity abutting the pleura in the posterior aspect of the superior segment right lower lobe measuring 6 x 5 mm, best seen on axial slice 48 series 7. There is a 1 mm nodular opacity abutting the pleura in the anterior segment right upper lobe seen on axial slice 28 series 7. There is a 1 mm nodular opacity in the lateral segment of the left lower lobe seen on axial slice 67 series 7. No pleural effusion or pleural thickening evident. No edema or consolidation. Upper Abdomen: Visualized upper  abdominal structures appear unremarkable. Musculoskeletal: There are no blastic or lytic bone lesions. There are no evident chest wall lesions. Review of the MIP images confirms the above findings. IMPRESSION: 1. No demonstrable pulmonary embolus. No thoracic aortic aneurysm or dissection. 2. 6 x 5 mm nodular opacity superior segment right lower lobe. Scattered 1 mm nodular opacity is also present bilaterally. Area of suspected rounded atelectasis posterior right base. No consolidation. Non-contrast chest CT at 6-12 months is recommended. If the nodule is stable at time of repeat CT, then future CT at 18-24 months (from today's scan) is considered optional for low-risk patients, but is recommended for high-risk patients. This recommendation follows the consensus statement: Guidelines for Management of Incidental Pulmonary Nodules Detected on CT Images: From the Fleischner Society 2017; Radiology 2017; 284:228-243. 3.  No evident thoracic adenopathy. Electronically Signed   By: Lowella Grip III M.D.   On: 07/04/2018 12:59     Assessment & Plan:  Plan  I am having Nicoya Wernette start on escitalopram and ALPRAZolam. I am also having her maintain her multivitamin, levonorgestrel, vitamin B-12, hydrOXYzine, and fluticasone.  Meds ordered this encounter  Medications  . escitalopram (LEXAPRO) 10 MG tablet    Sig: Take 1 tablet (10 mg total) by mouth at bedtime.    Dispense:  30 tablet    Refill:  2  . ALPRAZolam (XANAX) 0.25 MG tablet    Sig: Take 1 tablet (0.25 mg total) by mouth 2 (two) times daily as needed for anxiety.    Dispense:  20 tablet    Refill:  0    Problem List Items Addressed This Visit      Unprioritized   Anxiety - Primary   Relevant Medications   hydrOXYzine (ATARAX/VISTARIL) 10 MG tablet   escitalopram (LEXAPRO) 10 MG tablet   ALPRAZolam (XANAX) 0.25 MG tablet   Panic    Pt given small supply of xanax to help  She will not take it at work -- she is an [place] She will  also make app with counselor       Relevant Medications   hydrOXYzine (ATARAX/VISTARIL) 10 MG tablet   escitalopram (LEXAPRO) 10 MG tablet   ALPRAZolam (XANAX) 0.25 MG tablet      Follow-up: Return in about 4 weeks (around 08/28/2018), or if symptoms worsen or fail to improve, for anxiety.  Ann Held, DO

## 2018-07-31 NOTE — Assessment & Plan Note (Signed)
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

## 2018-08-02 ENCOUNTER — Emergency Department (HOSPITAL_COMMUNITY): Payer: 59

## 2018-08-02 ENCOUNTER — Emergency Department (HOSPITAL_COMMUNITY)
Admission: EM | Admit: 2018-08-02 | Discharge: 2018-08-02 | Disposition: A | Discharge status: Home / Self Care | Payer: 59 | Attending: Emergency Medicine | Admitting: Emergency Medicine

## 2018-08-02 ENCOUNTER — Other Ambulatory Visit: Payer: Self-pay

## 2018-08-02 ENCOUNTER — Encounter (HOSPITAL_COMMUNITY): Payer: Self-pay | Admitting: Emergency Medicine

## 2018-08-02 DIAGNOSIS — F419 Anxiety disorder, unspecified: Secondary | ICD-10-CM | POA: Insufficient documentation

## 2018-08-02 DIAGNOSIS — G4709 Other insomnia: Secondary | ICD-10-CM | POA: Diagnosis not present

## 2018-08-02 DIAGNOSIS — R0789 Other chest pain: Secondary | ICD-10-CM | POA: Insufficient documentation

## 2018-08-02 DIAGNOSIS — R5383 Other fatigue: Secondary | ICD-10-CM | POA: Diagnosis not present

## 2018-08-02 DIAGNOSIS — R079 Chest pain, unspecified: Secondary | ICD-10-CM | POA: Diagnosis not present

## 2018-08-02 HISTORY — DX: Anxiety disorder, unspecified: F41.9

## 2018-08-02 LAB — COMPREHENSIVE METABOLIC PANEL
ALT: 9 U/L (ref 0–44)
ANION GAP: 10 (ref 5–15)
AST: 22 U/L (ref 15–41)
Albumin: 4.1 g/dL (ref 3.5–5.0)
Alkaline Phosphatase: 52 U/L (ref 38–126)
BILIRUBIN TOTAL: 1.2 mg/dL (ref 0.3–1.2)
BUN: 11 mg/dL (ref 6–20)
CO2: 25 mmol/L (ref 22–32)
Calcium: 9.3 mg/dL (ref 8.9–10.3)
Chloride: 101 mmol/L (ref 98–111)
Creatinine, Ser: 0.79 mg/dL (ref 0.44–1.00)
GFR calc Af Amer: >60 mL/min (ref >60)
GFR calc non Af Amer: >60 mL/min (ref >60)
Glucose, Bld: 91 mg/dL (ref 70–99)
Potassium: 3.5 mmol/L (ref 3.5–5.1)
Sodium: 136 mmol/L (ref 135–145)
Total Protein: 7.3 g/dL (ref 6.5–8.1)

## 2018-08-02 LAB — CBC WITH DIFFERENTIAL/PLATELET
Abs Immature Granulocytes: 0.01 10*3/uL (ref 0.00–0.07)
Basophils Absolute: 0 10*3/uL (ref 0.0–0.1)
Basophils Relative: 0 %
EOS PCT: 0 %
Eosinophils Absolute: 0 10*3/uL (ref 0.0–0.5)
HCT: 42.2 % (ref 36.0–46.0)
Hemoglobin: 14.1 g/dL (ref 12.0–15.0)
Immature Granulocytes: 0 %
Lymphocytes Relative: 19 %
Lymphs Abs: 1.3 10*3/uL (ref 0.7–4.0)
MCH: 29.9 pg (ref 26.0–34.0)
MCHC: 33.4 g/dL (ref 30.0–36.0)
MCV: 89.4 fL (ref 80.0–100.0)
Monocytes Absolute: 0.4 10*3/uL (ref 0.1–1.0)
Monocytes Relative: 6 %
Neutro Abs: 5.1 10*3/uL (ref 1.7–7.7)
Neutrophils Relative %: 75 %
Platelets: 235 10*3/uL (ref 150–400)
RBC: 4.72 MIL/uL (ref 3.87–5.11)
RDW: 11.7 % (ref 11.5–15.5)
WBC: 6.9 10*3/uL (ref 4.0–10.5)
nRBC: 0 % (ref 0.0–0.2)

## 2018-08-02 LAB — I-STAT BETA HCG BLOOD, ED (MC, WL, AP ONLY): I-stat hCG, quantitative: <5 m[IU]/mL (ref <5)

## 2018-08-02 LAB — I-STAT TROPONIN, ED
Troponin i, poc: 0 ng/mL (ref 0.00–0.08)
Troponin i, poc: 0 ng/mL (ref 0.00–0.08)

## 2018-08-02 NOTE — ED Triage Notes (Addendum)
Pt states she has had 2 deaths in her family recently, she has not slept in days. Reports she was seen at Urgent care in [place] for same. She is having anxiety with chest pain that comes and goes. No chest pain presently. Pt states she feels very anxious. Pt states last night when she was having the chest pain that her left arm was also hurting. Pt states "I know this is just my anxiety."

## 2018-08-02 NOTE — ED Notes (Signed)
Patient transported to X-ray 

## 2018-08-02 NOTE — ED Provider Notes (Signed)
[facility]ERGENCY DEPARTMENT Provider Note   CSN: [id number] Arrival date & time: 08/02/18  1046     History   Chief Complaint Chief Complaint  Patient presents with  . Anxiety  . Chest Pain    HPI Lydia Davis is a 43 y.o. female.  HPI   Reports major anxiety issues, last week had panic attack Saw PCP, was given prn xanax and lexapro Haven't been sleeping for the last 5-6 nights, cannot shut brain off Last night felt anxious going to bed, then felt tingling all over the body, and left arm felt weak, tingling, all night long ignored it, but didn't sleep all night, then this AM after breakfast started feeling it in her chest, comes and goes, like a burning sensation, difficult to explain, in hte middle of the chest, sometimes feeling it in the arm, neck, back.    No shortness of breath  Last few weeks has been under severe massive stress. Had a cold in December, fluid behind ear, felt dizzy, that resolved.  Had odd feeling when she gets pressure in the back of the head and neck. Went back to ENT, said ears were ok.  When she feels panicky feels like going to lose it and like going to die.  No palpitations or heart racing, when super anxious HR on fitbit gets up to 110s but doesn't feel palpitations. Now is getting out of control.  10 days uncle died [place] of brain bleed, husband's cousin who she was close with died of neuroendorine cancer and passed away, diagnosed in 04-30-23, similar age. A lot of stress.    Has had anxiety before but not like this.   Since then thinking negative thoughts, cannot control it.  Has seen therapist for anxiety before but can't be seen until next month.   Last night didn't take xanax as she does not want to become reliant and wants to go to work. Took hydroxyzine 25mg  without help.   No hx of DVT/PE, has mirena, no recent surgeries long trips car or airplane.  Thinking maybe has GERD  Grandpa had MI 24s Dad  htn    Past Medical History:  Diagnosis Date  . Anxiety   . Eczema   . 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 Panic 07/31/2018  . Anxiety 07/31/2018  . Pulmonary nodule 07/04/2018  . AD (atopic dermatitis) 02/09/2015  . Preventative health care 06/17/2014    Past Surgical History:  Procedure Laterality Date  . CESAREAN SECTION  2012  . TONSILLECTOMY  1994     OB History   No obstetric history on file.      Home Medications    Prior to Admission medications   Medication Sig Start Date End Date Taking? Authorizing Provider  ALPRAZolam (XANAX) 0.25 MG tablet Take 1 tablet (0.25 mg total) by mouth 2 (two) times daily as needed for anxiety. 07/31/18  Yes Roma Schanz R, DO  escitalopram (LEXAPRO) 10 MG tablet Take 1 tablet (10 mg total) by mouth at bedtime. 07/31/18  Yes Roma Schanz R, DO  fluticasone (FLONASE) 50 MCG/ACT nasal spray Place 1 spray into both nostrils daily.    Yes [provider]  hydrOXYzine (ATARAX/VISTARIL) 10 MG tablet Take 10-20 mg by mouth as needed for itching.    Yes [provider]  ibuprofen (ADVIL,MOTRIN) 200 MG tablet Take 200 mg by mouth every  6 (six) hours as needed for mild pain.   Yes [provider]  levocetirizine (XYZAL) 5 MG tablet Take 5 mg by mouth every evening.   Yes [provider]  levonorgestrel (MIRENA) 20 MCG/24HR IUD 1 each by Intrauterine route once.   Yes [provider]    Family History Family History  Problem Relation Age of Onset  . Hypertension Father        and both sets of grandparents  . Hyperlipidemia Paternal Grandfather   . Heart disease Other        both sets of grandparents  . Diabetes Maternal Grandfather     Social History Social History   Tobacco Use  . Smoking status: Never Smoker  . Smokeless tobacco: Never Used  Substance Use Topics  . Alcohol use: Yes    Alcohol/week: 0.0  standard drinks    Comment: occasionally---glass of wine  . Drug use: No     Allergies   Augmentin [amoxicillin-pot clavulanate]   Review of Systems Review of Systems  Constitutional: Positive for fatigue.  HENT: Negative for congestion.   Respiratory: Negative for cough and shortness of breath.   Cardiovascular: Positive for chest pain. Negative for palpitations and leg swelling.  Gastrointestinal: Negative for abdominal pain, nausea and vomiting.  Genitourinary: Negative for dysuria.  Musculoskeletal: Positive for arthralgias.  Neurological: Positive for light-headedness. Negative for weakness and numbness.     Physical Exam Updated Vital Signs BP 105/73   Pulse 76   Temp 98.9 F (37.2 C) (Oral)   Resp 16   Ht 5\' 8"  (1.727 m)   Wt 69.4 kg   SpO2 99%   BMI 23.26 kg/m   Physical Exam Vitals signs and nursing note reviewed.  Constitutional:      General: She is not in acute distress.    Appearance: She is well-developed. She is not diaphoretic.  HENT:     Head: Normocephalic and atraumatic.  Eyes:     Conjunctiva/sclera: Conjunctivae normal.  Neck:     Musculoskeletal: Normal range of motion.  Cardiovascular:     Rate and Rhythm: Normal rate and regular rhythm.     Heart sounds: Normal heart sounds. No murmur. No friction rub. No gallop.   Pulmonary:     Effort: Pulmonary effort is normal. No respiratory distress.     Breath sounds: Normal breath sounds. No wheezing or rales.  Abdominal:     General: There is no distension.     Palpations: Abdomen is soft.     Tenderness: There is no abdominal tenderness. There is no guarding.  Musculoskeletal:        General: No tenderness.  Skin:    General: Skin is warm and dry.     Findings: No erythema or rash.  Neurological:     Mental Status: She is alert and oriented to person, place, and time.      ED Treatments / Results  Labs (all labs ordered are listed, but only abnormal results are displayed) Labs  Reviewed  CBC WITH DIFFERENTIAL/PLATELET  COMPREHENSIVE METABOLIC PANEL  I-STAT BETA HCG BLOOD, ED ([facility], [facility], [facility] ONLY)  I-STAT TROPONIN, ED  I-STAT TROPONIN, ED    EKG EKG Interpretation  Date/Time:  Saturday August 02 2018 10:50:26 EST Ventricular Rate:  96 PR Interval:  148 QRS Duration: 86 QT Interval:  364 QTC Calculation: 459 R Axis:   94 Text Interpretation:  Normal sinus rhythm Right atrial enlargement Rightward axis Pulmonary disease pattern Abnormal ECG No  significant change since last tracing Confirmed by Gareth Morgan [phone]) on 08/02/2018 12:36:21 PM   Radiology Dg Chest 2 View  Result Date: 08/02/2018 CLINICAL DATA:  Chest pain radiating to left arm which began last night. EXAM: CHEST - 2 VIEW COMPARISON:  07/04/2018 FINDINGS: The heart size and mediastinal contours are within normal limits. Both lungs are clear. The visualized skeletal structures are unremarkable. IMPRESSION: Negative.  No active cardiopulmonary disease. Electronically Signed   By: Earle Gell M.D.   On: 08/02/2018 11:59    Procedures Procedures (including critical care time)  Medications Ordered in ED Medications - No data to display   Initial Impression / Assessment and Plan / ED Course  I have reviewed the triage vital signs and the nursing notes.  Pertinent labs & imaging results that were available during my care of the patient were reviewed by me and considered in my medical decision making (see chart for details).     43yo female with history of anxiety and eczema presents with concern for chest pain and anxiety.  Differential diagnosis for chest pain includes pulmonary embolus, dissection, pneumothorax, pneumonia, ACS, myocarditis, pericarditis.  EKG was done and evaluate by me and showed no acute ST changes and no signs of pericarditis.  Chest x-ray was done and evaluated by me and radiology and showed no sign of pneumonia or pneumothorax. No dyspnea, no tachypnea, no tachycardia, and  had recent CT PE study 12/27 which showed no PE with similar symptoms.  Patient is low risk HEART score and had delta troponins which were both negative. Given this evaluation, history and physical have low suspicion for pulmonary embolus, pneumonia, ACS, myocarditis, pericarditis, dissection.   She certainly endorses feels of extreme anxiety and discussed anxiety management, however also think it is appropriate to follow up with Cardiology and PCP to continue consideration of other causes. Patient discharged in stable condition with understanding of reasons to return.   Final Clinical Impressions(s) / ED Diagnoses   Final diagnoses:  Chest pain, unspecified type  Anxiety    ED Discharge Orders    None       Gareth Morgan, MD 08/02/18 2116

## 2018-08-06 ENCOUNTER — Telehealth: Payer: Self-pay | Admitting: *Deleted

## 2018-08-06 NOTE — Telephone Encounter (Signed)
Received Audiological Evaluation Exam Report from [facility], MD, [place]; forwarded to provider/SLS 01/29

## 2018-08-25 ENCOUNTER — Ambulatory Visit (INDEPENDENT_AMBULATORY_CARE_PROVIDER_SITE_OTHER): Payer: 59 | Admitting: Psychology

## 2018-08-25 DIAGNOSIS — F4323 Adjustment disorder with mixed anxiety and depressed mood: Secondary | ICD-10-CM | POA: Diagnosis not present

## 2018-08-28 ENCOUNTER — Ambulatory Visit (INDEPENDENT_AMBULATORY_CARE_PROVIDER_SITE_OTHER): Payer: 59 | Admitting: Family Medicine

## 2018-08-28 ENCOUNTER — Encounter: Payer: Self-pay | Admitting: Family Medicine

## 2018-08-28 VITALS — BP 110/60 | HR 73 | Temp 98.4°F | Resp 16 | Ht 68.0 in | Wt 156.8 lb

## 2018-08-28 DIAGNOSIS — F419 Anxiety disorder, unspecified: Secondary | ICD-10-CM | POA: Diagnosis not present

## 2018-08-28 MED ORDER — ESCITALOPRAM OXALATE 20 MG PO TABS: 20.0000 mg | ORAL_TABLET | Freq: Every day | ORAL | 2 refills | Status: DC

## 2018-08-28 NOTE — Progress Notes (Signed)
Patient ID: Lydia Davis, female    DOB: 02/07/1976  Age: 43 y.o. MRN: [id number]    Subjective:  Subjective  HPI Lydia Davis presents for f/u anxiety.  She is doing much better but still has some panic attacks   No other complaints   Review of Systems  Constitutional: Negative for appetite change, diaphoresis, fatigue and unexpected weight change.  Eyes: Negative for pain, redness and visual disturbance.  Respiratory: Negative for cough, chest tightness, shortness of breath and wheezing.   Cardiovascular: Negative for chest pain, palpitations and leg swelling.  Endocrine: Negative for cold intolerance, heat intolerance, polydipsia, polyphagia and polyuria.  Genitourinary: Negative for difficulty urinating, dysuria and frequency.  Neurological: Negative for dizziness, light-headedness, numbness and headaches.  Psychiatric/Behavioral: Negative for confusion, decreased concentration, dysphoric mood, hallucinations, self-injury, sleep disturbance and suicidal ideas. The patient is nervous/anxious. The patient is not hyperactive.     History Past Medical History:  Diagnosis Date  . Anxiety   . Eczema   . History of TB skin testing    postive    She has a past surgical history that includes Cesarean section (2012) and Tonsillectomy (1994).   Her family history includes Diabetes in her maternal grandfather; Heart disease in an other family member; Hyperlipidemia in her paternal grandfather; Hypertension in her father.She reports that she has never smoked. She has never used smokeless tobacco. She reports current alcohol use. She reports that she does not use drugs.  Current Outpatient Medications on File Prior to Visit  Medication Sig Dispense Refill  . ALPRAZolam (XANAX) 0.25 MG tablet Take 1 tablet (0.25 mg total) by mouth 2 (two) times daily as needed for anxiety. 20 tablet 0  . fluticasone (FLONASE) 50 MCG/ACT nasal spray Place 1 spray into both nostrils daily.     . hydrOXYzine  (ATARAX/VISTARIL) 10 MG tablet Take 10-20 mg by mouth as needed for itching.     Marland Kitchen ibuprofen (ADVIL,MOTRIN) 200 MG tablet Take 200 mg by mouth every 6 (six) hours as needed for mild pain.    Marland Kitchen levocetirizine (XYZAL) 5 MG tablet Take 5 mg by mouth every evening.    Marland Kitchen levonorgestrel (MIRENA) 20 MCG/24HR IUD 1 each by Intrauterine route once.     No current facility-administered medications on file prior to visit.      Objective:  Objective  Physical Exam Vitals signs and nursing note reviewed.  Constitutional:      Appearance: She is well-developed.  HENT:     Head: Normocephalic and atraumatic.  Eyes:     Conjunctiva/sclera: Conjunctivae normal.  Neck:     Musculoskeletal: Normal range of motion and neck supple.     Thyroid: No thyromegaly.     Vascular: No carotid bruit or JVD.  Cardiovascular:     Rate and Rhythm: Normal rate and regular rhythm.     Heart sounds: Normal heart sounds. No murmur.  Pulmonary:     Effort: Pulmonary effort is normal. No respiratory distress.     Breath sounds: Normal breath sounds. No wheezing or rales.  Chest:     Chest wall: No tenderness.  Neurological:     Mental Status: She is alert and oriented to person, place, and time.  Psychiatric:        Attention and Perception: Attention normal.        Mood and Affect: Mood is anxious. Mood is not depressed. Affect is not flat or tearful.     Comments: Anxiety much better    BP  110/60 (BP Location: Right Arm, Cuff Size: Normal)   Pulse 73   Temp 98.4 F (36.9 C) (Oral)   Resp 16   Ht 5\' 8"  (1.727 m)   Wt 156 lb 12.8 oz (71.1 kg)   LMP 07/31/2017   SpO2 98%   BMI 23.84 kg/m  Wt Readings from Last 3 Encounters:  08/28/18 156 lb 12.8 oz (71.1 kg)  08/02/18 153 lb (69.4 kg)  07/31/18 156 lb 3.2 oz (70.9 kg)     Lab Results  Component Value Date   WBC 6.9 08/02/2018   HGB 14.1 08/02/2018   HCT 42.2 08/02/2018   PLT 235 08/02/2018   GLUCOSE 91 08/02/2018   CHOL 179 11/04/2017   TRIG  63.0 11/04/2017   HDL 46.10 11/04/2017   LDLCALC 120 (H) 11/04/2017   ALT 9 08/02/2018   AST 22 08/02/2018   NA 136 08/02/2018   K 3.5 08/02/2018   CL 101 08/02/2018   CREATININE 0.79 08/02/2018   BUN 11 08/02/2018   CO2 25 08/02/2018   TSH 1.80 11/04/2017    Dg Chest 2 View  Result Date: 08/02/2018 CLINICAL DATA:  Chest pain radiating to left arm which began last night. EXAM: CHEST - 2 VIEW COMPARISON:  07/04/2018 FINDINGS: The heart size and mediastinal contours are within normal limits. Both lungs are clear. The visualized skeletal structures are unremarkable. IMPRESSION: Negative.  No active cardiopulmonary disease. Electronically Signed   By: Earle Gell M.D.   On: 08/02/2018 11:59     Assessment & Plan:  Plan  I have discontinued Lydia Davis's escitalopram. I am also having her start on escitalopram. Additionally, I am having her maintain her levonorgestrel, hydrOXYzine, fluticasone, ALPRAZolam, ibuprofen, and levocetirizine.  Meds ordered this encounter  Medications  . escitalopram (LEXAPRO) 20 MG tablet    Sig: Take 1 tablet (20 mg total) by mouth daily.    Dispense:  30 tablet    Refill:  2    Problem List Items Addressed This Visit      Unprioritized   Anxiety - Primary   Relevant Medications   escitalopram (LEXAPRO) 20 MG tablet    xanax prn--- she has not needed it in a while Inc lexapro 20 mg --- rto 3 months or sooner prn  Follow-up: Return in about 3 months (around 11/26/2018), or if symptoms worsen or fail to improve.  Ann Held, DO                +

## 2018-08-28 NOTE — Patient Instructions (Signed)

## 2018-09-17 ENCOUNTER — Ambulatory Visit: Payer: 59 | Admitting: Psychology

## 2018-09-19 ENCOUNTER — Other Ambulatory Visit: Payer: Self-pay | Admitting: Family Medicine

## 2018-09-19 DIAGNOSIS — F419 Anxiety disorder, unspecified: Secondary | ICD-10-CM

## 2018-10-24 MED FILL — ESCITALOPRAM 20 MG TABLET: 20 | 90 days supply | Qty: 90 | Fill #0

## 2018-11-04 ENCOUNTER — Encounter: Payer: 59 | Admitting: Family Medicine

## 2019-01-20 MED FILL — ESCITALOPRAM 20 MG TABLET: 20 | 60 days supply | Qty: 60 | Fill #1

## 2019-01-26 ENCOUNTER — Other Ambulatory Visit: Payer: Self-pay

## 2019-01-26 ENCOUNTER — Encounter: Payer: Self-pay | Admitting: Family Medicine

## 2019-01-26 ENCOUNTER — Ambulatory Visit (INDEPENDENT_AMBULATORY_CARE_PROVIDER_SITE_OTHER): Payer: 59 | Admitting: Family Medicine

## 2019-01-26 VITALS — BP 115/69 | HR 75 | Temp 98.7°F | Resp 18 | Ht 68.0 in | Wt 157.2 lb

## 2019-01-26 DIAGNOSIS — F419 Anxiety disorder, unspecified: Secondary | ICD-10-CM | POA: Diagnosis not present

## 2019-01-26 DIAGNOSIS — Z0000 Encounter for general adult medical examination without abnormal findings: Secondary | ICD-10-CM | POA: Diagnosis not present

## 2019-01-26 MED ORDER — ESCITALOPRAM OXALATE 20 MG PO TABS: 20.0000 mg | ORAL_TABLET | Freq: Every day | ORAL | 1 refills | Status: DC

## 2019-01-26 NOTE — Patient Instructions (Signed)

## 2019-01-26 NOTE — Progress Notes (Signed)
+++++Subjective:     Lydia Davis is a 43 y.o. female and is here for a comprehensive physical exam. The patient reports no problems.  Social History   Socioeconomic History  . Marital status: Married    Spouse name: Not on file  . Number of children: Not on file  . Years of education: Not on file  . Highest education level: Not on file  Occupational History  . 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 Financial resource strain: Not on file  . Food insecurity    Worry: Not on file    Inability: Not on file  . Transportation needs    Medical: Not on file    Non-medical: Not on file  Tobacco Use  . Smoking status: Never Smoker  . Smokeless tobacco: Never Used  Substance and Sexual Activity  . Alcohol use: Yes    Alcohol/week: 0.0 standard drinks    Comment: occasionally---glass of wine  . Drug use: No  . Sexual activity: Yes    Partners: Male    Birth control/protection: I.U.D.  Lifestyle  . Physical activity    Days per week: Not on file    Minutes per session: Not on file  . Stress: Not on file  Relationships  . Social Herbalist on phone: Not on file    Gets together: Not on file    Attends religious service: Not on file    Active member of club or organization: Not on file    Attends meetings of clubs or organizations: Not on file    Relationship status: Not on file  . Intimate partner violence    Fear of current or ex partner: Not on file    Emotionally abused: Not on file    Physically abused: Not on file    Forced sexual activity: Not on file  Other Topics Concern  . Not on file  Social History Narrative   Exercise--Walking   Health Maintenance  Topic Date Due  . PAP SMEAR-Modifier  01/07/2019  . HIV Screening  11/05/2023 (Originally 12/28/1990)  . INFLUENZA VACCINE  02/07/2019  . TETANUS/TDAP  11/05/2027    The following portions of the patient's history were reviewed and updated  as appropriate:  She  has a past medical history of Anxiety, Eczema, and History of TB skin testing. She does not have any pertinent problems on file. She  has a past surgical history that includes Cesarean section (2012) and Tonsillectomy (1994). Her family history includes Diabetes in her maternal grandfather; Heart disease in an other family member; Hyperlipidemia in her paternal grandfather; Hypertension in her father. She  reports that she has never smoked. She has never used smokeless tobacco. She reports current alcohol use. She reports that she does not use drugs. She has a current medication list which includes the following prescription(s): alprazolam, escitalopram, fluticasone, hydroxyzine, ibuprofen, levocetirizine, and levonorgestrel. Current Outpatient Medications on File Prior to Visit  Medication Sig Dispense Refill  . ALPRAZolam (XANAX) 0.25 MG tablet Take 1 tablet (0.25 mg total) by mouth 2 (two) times daily as needed for anxiety. 20 tablet 0  . escitalopram (LEXAPRO) 20 MG tablet TAKE 1 TABLET BY MOUTH EVERY DAY 90 tablet 1  . fluticasone (FLONASE) 50 MCG/ACT nasal spray Place 1 spray into both nostrils daily.     . hydrOXYzine (ATARAX/VISTARIL) 10 MG tablet Take 10-20 mg by mouth as needed  for itching.     Marland Kitchen ibuprofen (ADVIL,MOTRIN) 200 MG tablet Take 200 mg by mouth every 6 (six) hours as needed for mild pain.    Marland Kitchen levocetirizine (XYZAL) 5 MG tablet Take 5 mg by mouth every evening.    Marland Kitchen levonorgestrel (MIRENA) 20 MCG/24HR IUD 1 each by Intrauterine route once.     No current facility-administered medications on file prior to visit.    She is allergic to augmentin [amoxicillin-pot clavulanate]..  Review of Systems Review of Systems  Constitutional: Negative for activity change, appetite change and fatigue.  HENT: Negative for hearing loss, congestion, tinnitus and ear discharge.  dentist q80m Eyes: Negative for visual disturbance (see optho q1y -- vision corrected to  20/20 with glasses).  Respiratory: Negative for cough, chest tightness and shortness of breath.   Cardiovascular: Negative for chest pain, palpitations and leg swelling.  Gastrointestinal: Negative for abdominal pain, diarrhea, constipation and abdominal distention.  Genitourinary: Negative for urgency, frequency, decreased urine volume and difficulty urinating.  Musculoskeletal: Negative for back pain, arthralgias and gait problem.  Skin: Negative for color change, pallor and rash.  Neurological: Negative for dizziness, light-headedness, numbness and headaches.  Hematological: Negative for adenopathy. Does not bruise/bleed easily.  Psychiatric/Behavioral: Negative for suicidal ideas, confusion, sleep disturbance, self-injury, dysphoric mood, decreased concentration and agitation.       Objective:    BP 115/69 (BP Location: Left Arm, Patient Position: Sitting, Cuff Size: Normal)   Pulse 75   Temp 98.7 F (37.1 C) (Oral)   Resp 18   Ht 5\' 8"  (1.727 m)   Wt 157 lb 3.2 oz (71.3 kg)   LMP 12/26/2018   SpO2 99%   BMI 23.90 kg/m  General appearance: alert, cooperative, appears stated age and no distress Head: Normocephalic, without obvious abnormality, atraumatic Eyes: conjunctivae/corneas clear. PERRL, EOM's intact. Fundi benign. Ears: normal TM's and external ear canals both ears Nose: Nares normal. Septum midline. Mucosa normal. No drainage or sinus tenderness. Throat: lips, mucosa, and tongue normal; teeth and gums normal Neck: no adenopathy, no carotid bruit, no JVD, supple, symmetrical, trachea midline and thyroid not enlarged, symmetric, no tenderness/mass/nodules Back: symmetric, no curvature. ROM normal. No CVA tenderness. Lungs: clear to auscultation bilaterally Breasts: gyn Heart: regular rate and rhythm, S1, S2 normal, no murmur, click, rub or gallop Abdomen: soft, non-tender; bowel sounds normal; no masses,  no organomegaly Pelvic: deferred--gyn Extremities:  extremities normal, atraumatic, no cyanosis or edema Pulses: 2+ and symmetric Skin: Skin color, texture, turgor normal. No rashes or lesions Lymph nodes: Cervical, supraclavicular, and axillary nodes normal. Neurologic: Alert and oriented X 3, normal strength and tone. Normal symmetric reflexes. Normal coordination and gait    Assessment:    Healthy female exam.      Plan:    ghm utd Check labs  See After Visit Summary for Counseling Recommendations    1. Preventative health care See above  - CBC with Differential/Platelet - Lipid panel - TSH - Comprehensive metabolic panel  2. Anxiety Stable Doing well with the lexapro  - escitalopram (LEXAPRO) 20 MG tablet; Take 1 tablet (20 mg total) by mouth daily.  Dispense: 90 tablet; Refill: 1

## 2019-01-27 LAB — LIPID PANEL
Cholesterol: 169 mg/dL (ref 0–200)
HDL: 40.2 mg/dL (ref >39.00)
LDL Cholesterol: 102 mg/dL — ABNORMAL HIGH (ref 0–99)
NonHDL: 128.3
Total CHOL/HDL Ratio: 4
Triglycerides: 132 mg/dL (ref 0.0–149.0)
VLDL: 26.4 mg/dL (ref 0.0–40.0)

## 2019-01-27 LAB — CBC WITH DIFFERENTIAL/PLATELET
Basophils Absolute: 0 10*3/uL (ref 0.0–0.1)
Basophils Relative: 0.6 % (ref 0.0–3.0)
Eosinophils Absolute: 0.1 10*3/uL (ref 0.0–0.7)
Eosinophils Relative: 1.8 % (ref 0.0–5.0)
HCT: 39.3 % (ref 36.0–46.0)
Hemoglobin: 13.2 g/dL (ref 12.0–15.0)
Lymphocytes Relative: 29.4 % (ref 12.0–46.0)
Lymphs Abs: 1.6 10*3/uL (ref 0.7–4.0)
MCHC: 33.6 g/dL (ref 30.0–36.0)
MCV: 91 fl (ref 78.0–100.0)
Monocytes Absolute: 0.5 10*3/uL (ref 0.1–1.0)
Monocytes Relative: 9.5 % (ref 3.0–12.0)
Neutro Abs: 3.2 10*3/uL (ref 1.4–7.7)
Neutrophils Relative %: 58.7 % (ref 43.0–77.0)
Platelets: 213 10*3/uL (ref 150.0–400.0)
RBC: 4.32 Mil/uL (ref 3.87–5.11)
RDW: 12.3 % (ref 11.5–15.5)
WBC: 5.4 10*3/uL (ref 4.0–10.5)

## 2019-01-27 LAB — TSH: TSH: 1.13 u[IU]/mL (ref 0.35–4.50)

## 2019-01-27 LAB — COMPREHENSIVE METABOLIC PANEL
ALT: 5 U/L (ref 0–35)
AST: 15 U/L (ref 0–37)
Albumin: 4.1 g/dL (ref 3.5–5.2)
Alkaline Phosphatase: 54 U/L (ref 39–117)
BUN: 15 mg/dL (ref 6–23)
CO2: 30 mEq/L (ref 19–32)
Calcium: 9 mg/dL (ref 8.4–10.5)
Chloride: 102 mEq/L (ref 96–112)
Creatinine, Ser: 0.86 mg/dL (ref 0.40–1.20)
GFR: 71.99 mL/min (ref >60.00)
Glucose, Bld: 70 mg/dL (ref 70–99)
Potassium: 4.2 mEq/L (ref 3.5–5.1)
Sodium: 138 mEq/L (ref 135–145)
Total Bilirubin: 0.7 mg/dL (ref 0.2–1.2)
Total Protein: 6.5 g/dL (ref 6.0–8.3)

## 2019-03-25 ENCOUNTER — Other Ambulatory Visit: Payer: Self-pay | Admitting: Family Medicine

## 2019-03-25 ENCOUNTER — Telehealth: Payer: Self-pay | Admitting: Family Medicine

## 2019-03-25 DIAGNOSIS — F419 Anxiety disorder, unspecified: Secondary | ICD-10-CM

## 2019-03-25 MED ORDER — ESCITALOPRAM OXALATE 20 MG PO TABS: 20.0000 mg | ORAL_TABLET | Freq: Every day | ORAL | 1 refills | Status: DC

## 2019-03-25 NOTE — Telephone Encounter (Signed)
Copied from [place] [id number]. Topic: Quick Communication - Rx Refill/Question >> Mar 25, 2019 10:18 AM Leward Quan A wrote: Medication: escitalopram (LEXAPRO) 20 MG tablet   Has the patient contacted their pharmacy? Yes.   (Agent: If no, request that the patient contact the pharmacy for the refill.) (Agent: If yes, when and what did the pharmacy advise?)  Preferred Pharmacy (with phone number or street name): [place], [place] - [place] [phone] (Phone) [phone] (Fax)    Agent: Please be advised that RX refills may take up to 3 business days. We ask that you follow-up with your pharmacy.

## 2019-03-26 ENCOUNTER — Other Ambulatory Visit: Payer: Self-pay | Admitting: Family Medicine

## 2019-03-26 ENCOUNTER — Other Ambulatory Visit: Payer: Self-pay

## 2019-03-26 DIAGNOSIS — F419 Anxiety disorder, unspecified: Secondary | ICD-10-CM

## 2019-03-26 MED FILL — ESCITALOPRAM 20 MG TABLET: 20 | 90 days supply | Qty: 90 | Fill #0

## 2019-03-26 NOTE — Telephone Encounter (Signed)
Refill sent.

## 2019-03-26 NOTE — Telephone Encounter (Signed)
Patient called in asking that script be sent to [facility] outpatient pharmacy instead. Please advise.

## 2019-04-14 ENCOUNTER — Encounter: Payer: Self-pay | Admitting: Family Medicine

## 2019-04-14 ENCOUNTER — Other Ambulatory Visit: Payer: Self-pay | Admitting: Family Medicine

## 2019-04-15 ENCOUNTER — Other Ambulatory Visit: Payer: Self-pay | Admitting: Family Medicine

## 2019-04-15 DIAGNOSIS — R0789 Other chest pain: Secondary | ICD-10-CM

## 2019-04-15 NOTE — Telephone Encounter (Signed)
Order is in.

## 2019-04-20 ENCOUNTER — Other Ambulatory Visit: Payer: Self-pay

## 2019-04-20 ENCOUNTER — Ambulatory Visit (HOSPITAL_BASED_OUTPATIENT_CLINIC_OR_DEPARTMENT_OTHER)
Admission: RE | Admit: 2019-04-20 | Discharge: 2019-04-20 | Disposition: A | Discharge status: Home / Self Care | Payer: 59 | Source: Ambulatory Visit | Attending: Family Medicine | Admitting: Family Medicine

## 2019-04-20 DIAGNOSIS — R0789 Other chest pain: Secondary | ICD-10-CM | POA: Insufficient documentation

## 2019-04-20 DIAGNOSIS — R918 Other nonspecific abnormal finding of lung field: Secondary | ICD-10-CM | POA: Diagnosis not present

## 2019-06-23 ENCOUNTER — Encounter: Payer: Self-pay | Admitting: Family Medicine

## 2019-06-23 MED ORDER — HYDROXYZINE HCL 10 MG PO TABS: 10.0000 mg | ORAL_TABLET | ORAL | 0 refills | Status: DC | PRN

## 2019-06-25 MED FILL — ESCITALOPRAM 20 MG TABLET: 20 | 90 days supply | Qty: 90 | Fill #1

## 2019-07-04 ENCOUNTER — Other Ambulatory Visit: Payer: Self-pay | Admitting: Family Medicine

## 2019-08-03 DIAGNOSIS — Z1231 Encounter for screening mammogram for malignant neoplasm of breast: Secondary | ICD-10-CM | POA: Diagnosis not present

## 2019-08-03 DIAGNOSIS — Z6823 Body mass index (BMI) 23.0-23.9, adult: Secondary | ICD-10-CM | POA: Diagnosis not present

## 2019-08-03 DIAGNOSIS — Z01419 Encounter for gynecological examination (general) (routine) without abnormal findings: Secondary | ICD-10-CM | POA: Diagnosis not present

## 2019-09-29 ENCOUNTER — Other Ambulatory Visit: Payer: Self-pay | Admitting: Family Medicine

## 2019-09-29 DIAGNOSIS — F419 Anxiety disorder, unspecified: Secondary | ICD-10-CM

## 2019-09-29 MED FILL — ESCITALOPRAM 20 MG TABLET: 20 | 90 days supply | Qty: 90 | Fill #0

## 2020-01-02 MED FILL — ESCITALOPRAM 20 MG TABLET: 20 | 90 days supply | Qty: 90 | Fill #1

## 2020-01-28 ENCOUNTER — Other Ambulatory Visit: Payer: Self-pay

## 2020-01-28 ENCOUNTER — Encounter: Payer: Self-pay | Admitting: Family Medicine

## 2020-01-28 ENCOUNTER — Ambulatory Visit (INDEPENDENT_AMBULATORY_CARE_PROVIDER_SITE_OTHER): Payer: 59 | Admitting: Family Medicine

## 2020-01-28 VITALS — BP 114/72 | HR 77 | Resp 17 | Ht 68.0 in | Wt 157.0 lb

## 2020-01-28 DIAGNOSIS — Z1159 Encounter for screening for other viral diseases: Secondary | ICD-10-CM

## 2020-01-28 DIAGNOSIS — Z0000 Encounter for general adult medical examination without abnormal findings: Secondary | ICD-10-CM

## 2020-01-28 DIAGNOSIS — L309 Dermatitis, unspecified: Secondary | ICD-10-CM | POA: Diagnosis not present

## 2020-01-28 LAB — CBC WITH DIFFERENTIAL/PLATELET
Basophils Absolute: 0 10*3/uL (ref 0.0–0.1)
Basophils Relative: 0.5 % (ref 0.0–3.0)
Eosinophils Absolute: 0.1 10*3/uL (ref 0.0–0.7)
Eosinophils Relative: 1.7 % (ref 0.0–5.0)
HCT: 41.6 % (ref 36.0–46.0)
Hemoglobin: 14.1 g/dL (ref 12.0–15.0)
Lymphocytes Relative: 25.3 % (ref 12.0–46.0)
Lymphs Abs: 1.4 10*3/uL (ref 0.7–4.0)
MCHC: 34 g/dL (ref 30.0–36.0)
MCV: 90.4 fl (ref 78.0–100.0)
Monocytes Absolute: 0.4 10*3/uL (ref 0.1–1.0)
Monocytes Relative: 7.3 % (ref 3.0–12.0)
Neutro Abs: 3.5 10*3/uL (ref 1.4–7.7)
Neutrophils Relative %: 65.2 % (ref 43.0–77.0)
Platelets: 248 10*3/uL (ref 150.0–400.0)
RBC: 4.6 Mil/uL (ref 3.87–5.11)
RDW: 12.6 % (ref 11.5–15.5)
WBC: 5.4 10*3/uL (ref 4.0–10.5)

## 2020-01-28 LAB — COMPREHENSIVE METABOLIC PANEL
ALT: 6 U/L (ref 0–35)
AST: 15 U/L (ref 0–37)
Albumin: 4.3 g/dL (ref 3.5–5.2)
Alkaline Phosphatase: 60 U/L (ref 39–117)
BUN: 15 mg/dL (ref 6–23)
CO2: 31 mEq/L (ref 19–32)
Calcium: 9.6 mg/dL (ref 8.4–10.5)
Chloride: 102 mEq/L (ref 96–112)
Creatinine, Ser: 0.87 mg/dL (ref 0.40–1.20)
GFR: 70.7 mL/min (ref >60.00)
Glucose, Bld: 88 mg/dL (ref 70–99)
Potassium: 4.5 mEq/L (ref 3.5–5.1)
Sodium: 137 mEq/L (ref 135–145)
Total Bilirubin: 1 mg/dL (ref 0.2–1.2)
Total Protein: 7.5 g/dL (ref 6.0–8.3)

## 2020-01-28 LAB — LIPID PANEL
Cholesterol: 229 mg/dL — ABNORMAL HIGH (ref 0–200)
HDL: 46.6 mg/dL (ref >39.00)
LDL Cholesterol: 166 mg/dL — ABNORMAL HIGH (ref 0–99)
NonHDL: 181.92
Total CHOL/HDL Ratio: 5
Triglycerides: 79 mg/dL (ref 0.0–149.0)
VLDL: 15.8 mg/dL (ref 0.0–40.0)

## 2020-01-28 LAB — TSH: TSH: 2.51 u[IU]/mL (ref 0.35–4.50)

## 2020-01-28 MED ORDER — ESCITALOPRAM OXALATE 10 MG PO TABS: 10.0000 mg | ORAL_TABLET | Freq: Every day | ORAL | 1 refills | Status: DC

## 2020-01-28 NOTE — Progress Notes (Signed)
Subjective:     Lydia Davis is a 44 y.o. female and is here for a comprehensive physical exam. The patient reports no problems.  Social History   Socioeconomic History  . Marital status: Married    Spouse name: Not on file  . Number of children: Not on file  . Years of education: Not on file  . Highest education level: Not on file  Occupational History  . Occupation: Surveyor, quantity: [employer]    Comment: works in the [employer] Use  . Smoking status: Never Smoker  . Smokeless tobacco: Never Used  Substance and Sexual Activity  . Alcohol use: Yes    Alcohol/week: 0.0 standard drinks    Comment: occasionally---glass of wine  . Drug use: No  . Sexual activity: Yes    Partners: Male    Birth control/protection: I.U.D.  Other Topics Concern  . Not on file  Social History Narrative   Exercise--Walking   Social Determinants of Health   Financial Resource Strain:   . Difficulty of Paying Living Expenses:   Food Insecurity:   . Worried About Charity fundraiser in the Last Year:   . Arboriculturist in the Last Year:   Transportation Needs:   . Film/video editor (Medical):   Marland Kitchen Lack of Transportation (Non-Medical):   Physical Activity:   . Days of Exercise per Week:   . Minutes of Exercise per Session:   Stress:   . Feeling of Stress :   Social Connections:   . Frequency of Communication with Friends and Family:   . Frequency of Social Gatherings with Friends and Family:   . Attends Religious Services:   . Active Member of Clubs or Organizations:   . Attends Archivist Meetings:   Marland Kitchen Marital Status:   Intimate Partner Violence:   . Fear of Current or Ex-Partner:   . Emotionally Abused:   Marland Kitchen Physically Abused:   . Sexually Abused:    Health Maintenance  Topic Date Due  . Hepatitis C Screening  Never done  . MAMMOGRAM  Never done  . PAP SMEAR-Modifier  01/07/2019  . HIV Screening  11/05/2023 (Originally 12/28/1990)  . INFLUENZA  VACCINE  02/07/2020  . TETANUS/TDAP  11/05/2027    The following portions of the patient's history were reviewed and updated as appropriate:  She  has a past medical history of Anxiety, Eczema, and History of TB skin testing. She does not have any pertinent problems on file. She  has a past surgical history that includes Cesarean section (2012) and Tonsillectomy (1994). Her family history includes Diabetes in her maternal grandfather; Heart disease in an other family member; Hyperlipidemia in her paternal grandfather; Hypertension in her father. She  reports that she has never smoked. She has never used smokeless tobacco. She reports current alcohol use. She reports that she does not use drugs. She has a current medication list which includes the following prescription(s): cetirizine, escitalopram, hydroxyzine, levonorgestrel, and alprazolam. Current Outpatient Medications on File Prior to Visit  Medication Sig Dispense Refill  . cetirizine (ZYRTEC) 10 MG tablet Take 10 mg by mouth daily.    Marland Kitchen escitalopram (LEXAPRO) 20 MG tablet TAKE 1 TABLET BY MOUTH ONCE DAILY 90 tablet 1  . hydrOXYzine (ATARAX/VISTARIL) 10 MG tablet TAKE 1-2 TABLETS (10-20 MG TOTAL) BY MOUTH AS NEEDED FOR ITCHING. 180 tablet 1  . levonorgestrel (MIRENA) 20 MCG/24HR IUD 1 each by Intrauterine route once.    Marland Kitchen  ALPRAZolam (XANAX) 0.25 MG tablet Take 1 tablet (0.25 mg total) by mouth 2 (two) times daily as needed for anxiety. (Patient not taking: Reported on 01/28/2020) 20 tablet 0   No current facility-administered medications on file prior to visit.   She is allergic to augmentin [amoxicillin-pot clavulanate]..  Review of Systems Review of Systems  Constitutional: Negative for activity change, appetite change and fatigue.  HENT: Negative for hearing loss, congestion, tinnitus and ear discharge.  dentist q53m Eyes: Negative for visual disturbance (see optho q2y).  Respiratory: Negative for cough, chest tightness and  shortness of breath.   Cardiovascular: Negative for chest pain, palpitations and leg swelling.  Gastrointestinal: Negative for abdominal pain, diarrhea, constipation and abdominal distention.  Genitourinary: Negative for urgency, frequency, decreased urine volume and difficulty urinating.  Musculoskeletal: Negative for back pain, arthralgias and gait problem.  Skin: Negative for color change, pallor and rash.  Neurological: Negative for dizziness, light-headedness, numbness and headaches.  Hematological: Negative for adenopathy. Does not bruise/bleed easily.  Psychiatric/Behavioral: Negative for suicidal ideas, confusion, sleep disturbance, self-injury, dysphoric mood, decreased concentration and agitation.       Objective:    BP 114/72 (BP Location: Right Arm, Patient Position: Sitting, Cuff Size: Normal)   Pulse 77   Resp 17   Ht 5\' 8"  (1.727 m)   Wt 157 lb (71.2 kg)   SpO2 97%   BMI 23.87 kg/m  General appearance: alert, cooperative, appears stated age and no distress Head: Normocephalic, without obvious abnormality, atraumatic Eyes: conjunctivae/corneas clear. PERRL, EOM's intact. Fundi benign. Ears: normal TM's and external ear canals both ears Neck: no adenopathy, no carotid bruit, no JVD, supple, symmetrical, trachea midline and thyroid not enlarged, symmetric, no tenderness/mass/nodules Back: symmetric, no curvature. ROM normal. No CVA tenderness. Lungs: clear to auscultation bilaterally Breasts: gyn Heart: regular rate and rhythm, S1, S2 normal, no murmur, click, rub or gallop Abdomen: soft, non-tender; bowel sounds normal; no masses,  no organomegaly Pelvic: deferred Extremities: extremities normal, atraumatic, no cyanosis or edema Pulses: 2+ and symmetric Skin: Skin color, texture, turgor normal. No rashes or lesions Lymph nodes: Cervical, supraclavicular, and axillary nodes normal. Neurologic: Alert and oriented X 3, normal strength and tone. Normal symmetric  reflexes. Normal coordination and gait    Assessment:    Healthy female exam.      Plan:     ghm utd Check labs  See After Visit Summary for Counseling Recommendations    1. Eczema, unspecified type  - Ambulatory referral to Dermatology - escitalopram (LEXAPRO) 10 MG tablet; Take 1 tablet (10 mg total) by mouth daily.  Dispense: 90 tablet; Refill: 1  2. Preventative health care See above  - Lipid panel - CBC with Differential/Platelet - TSH - Comprehensive metabolic panel  3. Need for hepatitis C screening test  - Hepatitis C antibody

## 2020-01-28 NOTE — Patient Instructions (Signed)

## 2020-01-29 ENCOUNTER — Other Ambulatory Visit: Payer: Self-pay | Admitting: Family Medicine

## 2020-01-29 DIAGNOSIS — E785 Hyperlipidemia, unspecified: Secondary | ICD-10-CM

## 2020-01-29 DIAGNOSIS — E1169 Type 2 diabetes mellitus with other specified complication: Secondary | ICD-10-CM

## 2020-01-29 LAB — HEPATITIS C ANTIBODY
Hepatitis C Ab: NONREACTIVE
SIGNAL TO CUT-OFF: 0.02 (ref <1.00)

## 2020-02-02 ENCOUNTER — Encounter: Payer: 59 | Admitting: Family Medicine

## 2020-02-22 ENCOUNTER — Encounter: Payer: Self-pay | Admitting: Family Medicine

## 2020-03-24 ENCOUNTER — Other Ambulatory Visit: Payer: Self-pay | Admitting: Family Medicine

## 2020-03-24 ENCOUNTER — Telehealth: Payer: Self-pay | Admitting: Family Medicine

## 2020-03-24 DIAGNOSIS — F419 Anxiety disorder, unspecified: Secondary | ICD-10-CM

## 2020-03-24 MED ORDER — ESCITALOPRAM OXALATE 20 MG PO TABS: 20.0000 mg | ORAL_TABLET | Freq: Every day | ORAL | 1 refills | Status: DC

## 2020-03-24 NOTE — Telephone Encounter (Signed)
Patient is requesting a refill and to have medication changed back to previous dosage of 20MG .    Medication: escitalopram (LEXAPRO)  Has the patient contacted their pharmacy? No. (If no, request that the patient contact the pharmacy for the refill.) (If yes, when and what did the pharmacy advise?)  Preferred Pharmacy (with phone number or street name): [id number] - [place], [place] - [address], [place] [address]  Phone:  [phone] Fax:  [phone]  DEA #:  [id number]  Agent: Please be advised that RX refills may take up to 3 business days. We ask that you follow-up with your pharmacy.

## 2020-03-24 NOTE — Telephone Encounter (Signed)
yes

## 2020-03-24 NOTE — Telephone Encounter (Signed)
Refill sent.

## 2020-03-24 NOTE — Telephone Encounter (Signed)
Okay to send the 20 MG dose requested by patient? Please advise

## 2020-03-30 MED FILL — ESCITALOPRAM 20 MG TABLET: 20 | 90 days supply | Qty: 90 | Fill #0

## 2020-04-18 DIAGNOSIS — L2084 Intrinsic (allergic) eczema: Secondary | ICD-10-CM | POA: Diagnosis not present

## 2020-05-16 DIAGNOSIS — L2084 Intrinsic (allergic) eczema: Secondary | ICD-10-CM | POA: Diagnosis not present

## 2020-05-16 DIAGNOSIS — Z79899 Other long term (current) drug therapy: Secondary | ICD-10-CM | POA: Diagnosis not present

## 2020-05-19 ENCOUNTER — Telehealth: Payer: Self-pay | Admitting: Pharmacist

## 2020-05-19 NOTE — Telephone Encounter (Signed)
Called patient to schedule an appointment for the [facility]. I was unable to reach the patient so I left a HIPAA-compliant message requesting that the patient return my call.   Benard Halsted, PharmD, [place] [phone]

## 2020-07-05 ENCOUNTER — Ambulatory Visit (HOSPITAL_BASED_OUTPATIENT_CLINIC_OR_DEPARTMENT_OTHER): Payer: 59 | Admitting: Pharmacist

## 2020-07-05 ENCOUNTER — Encounter: Payer: Self-pay | Admitting: Pharmacist

## 2020-07-05 ENCOUNTER — Other Ambulatory Visit: Payer: Self-pay | Admitting: Internal Medicine

## 2020-07-05 ENCOUNTER — Other Ambulatory Visit: Payer: Self-pay

## 2020-07-05 DIAGNOSIS — Z79899 Other long term (current) drug therapy: Secondary | ICD-10-CM

## 2020-07-05 MED ORDER — DUPIXENT 300 MG/2ML ~~LOC~~ SOAJ: SUBCUTANEOUS | 4 refills | Status: DC

## 2020-07-05 NOTE — Progress Notes (Signed)
° °  S: Patient presents for review of their specialty medication therapy.   Patient is currently taking Dupixent for atopic dermatitis. Patient is managed by Dr. Sharyn Lull for this.   Adherence: has not started  Efficacy: has not started  Dosing: 600 mg x1 followed by 300mg  q14days  Dose adjustments: Renal: no dose adjustments (has not been studied) Hepatic: no dose adjustments (has not been studied)  Drug-drug interactions: none identified  Monitoring: S/sx of infection: none S/sx of hypersensitivity: none S/sx of ocular effects: none  S/sx of eosinophilia/vasculitis: none  O:     Lab Results  Component Value Date   WBC 5.4 01/28/2020   HGB 14.1 01/28/2020   HCT 41.6 01/28/2020   MCV 90.4 01/28/2020   PLT 248.0 01/28/2020      Chemistry      Component Value Date/Time   NA 137 01/28/2020 1329   K 4.5 01/28/2020 1329   CL 102 01/28/2020 1329   CO2 31 01/28/2020 1329   BUN 15 01/28/2020 1329   CREATININE 0.87 01/28/2020 1329      Component Value Date/Time   CALCIUM 9.6 01/28/2020 1329   ALKPHOS 60 01/28/2020 1329   AST 15 01/28/2020 1329   ALT 6 01/28/2020 1329   BILITOT 1.0 01/28/2020 1329       A/P: 1. Medication review: Patient currently on Dupixent for atopic dermatitis. Reviewed the medication with the patient, including the following: Dupixent is a monoclonal antibody used for the treatment of asthma or atopic dermatitis. Patient educated on purpose, proper use and potential adverse effects of Dupixent. Possible adverse effects include increased risk of infection, ocular effects, vasculitis/eosinophilia, and hypersensitivity reactions. Administer as a SubQ injection and rotate sites. Allow the medication to reach room temp prior to administration (45 mins for 300 mg syringe or 30 min for 200 mg syringe). Do not shake. Discard any unused portion. No recommendations for any changes.   01/30/2020, PharmD, Butch Penny, CPP Clinical Pharmacist [facility] [phone]

## 2020-07-06 MED FILL — DUPIXENT 300 MG/2ML SOPN: 300 | 28 days supply | Qty: 4 | Fill #0

## 2020-07-07 MED FILL — ESCITALOPRAM 20 MG TABLET: 20 | 90 days supply | Qty: 90 | Fill #1

## 2020-07-18 DIAGNOSIS — L2084 Intrinsic (allergic) eczema: Secondary | ICD-10-CM | POA: Diagnosis not present

## 2020-07-24 IMAGING — DX DG CHEST 2V
2 series · 2 of 2 positions shown · non-contrast
Comparison: 07/04/2018

CLINICAL DATA: Chest pain radiating to left arm which began last
night.

EXAM:
CHEST - 2 VIEW

[chest pa]
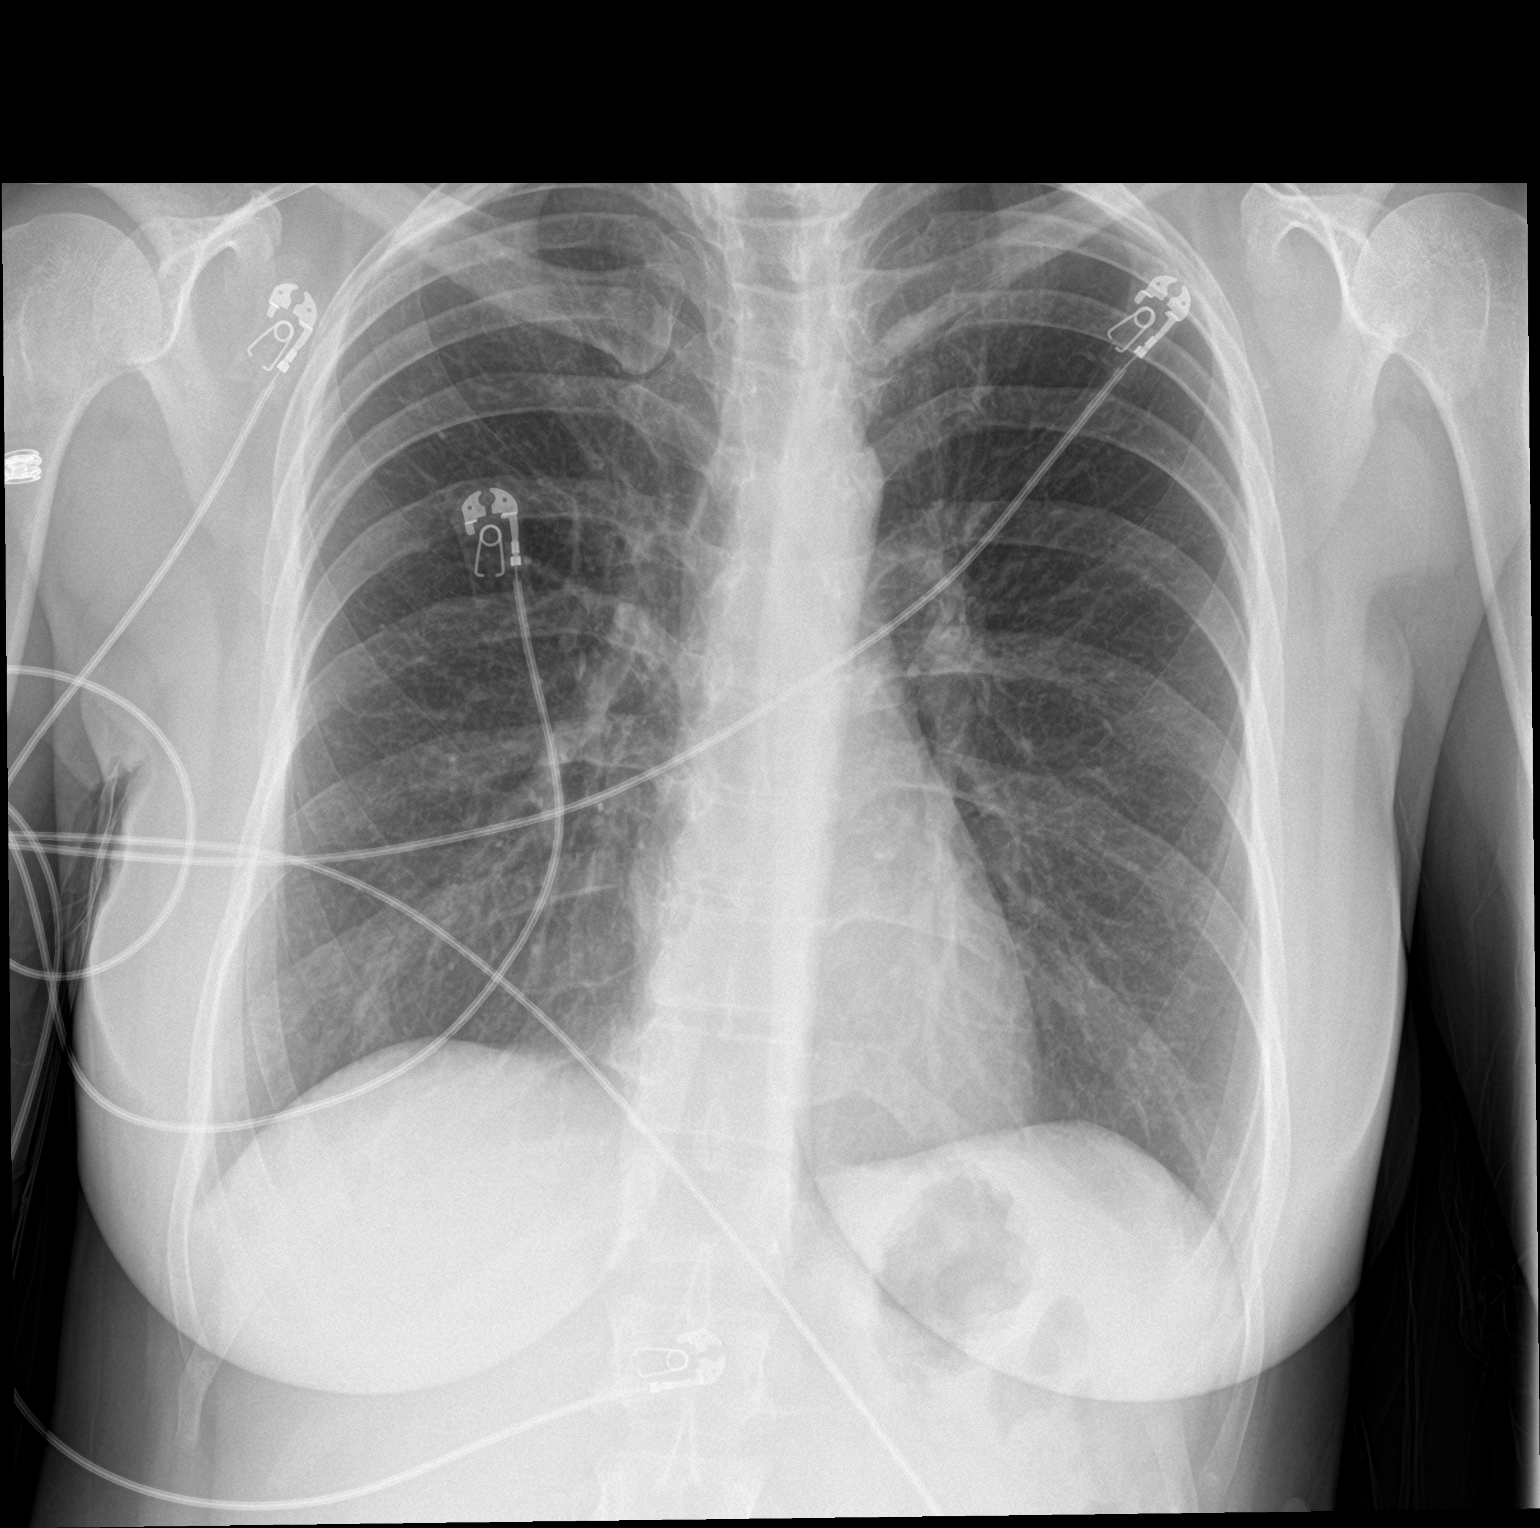

[chest lat]
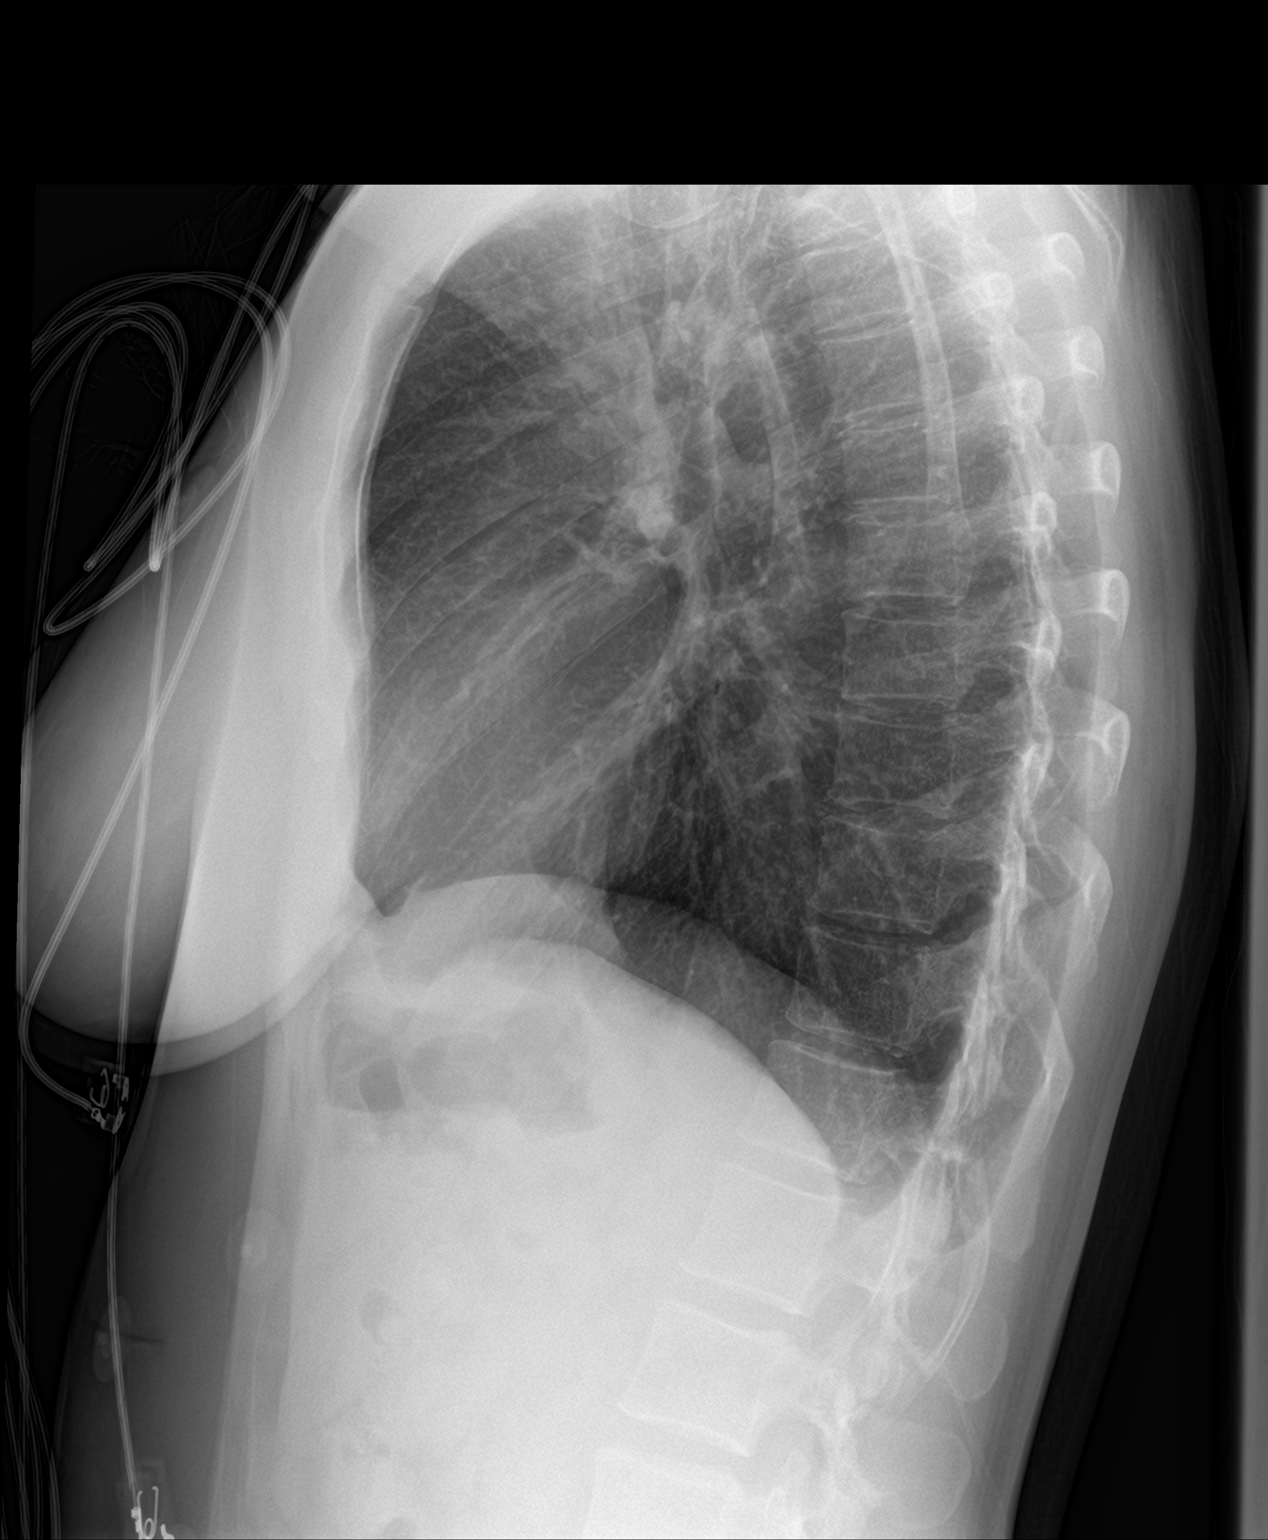

[2 of 2 positions shown; findings below may reference images not displayed]

FINDINGS: The heart size and mediastinal contours are within normal limits.
Both lungs are clear. The visualized skeletal structures are
unremarkable.
IMPRESSION: Negative.  No active cardiopulmonary disease.

## 2020-07-27 MED FILL — DUPIXENT 300 MG/2ML SOPN: 300 | 28 days supply | Qty: 4 | Fill #1

## 2020-08-24 MED FILL — DUPIXENT 300 MG/2ML SOPN: 300 | 28 days supply | Qty: 4 | Fill #2

## 2020-08-30 DIAGNOSIS — Z01419 Encounter for gynecological examination (general) (routine) without abnormal findings: Secondary | ICD-10-CM | POA: Diagnosis not present

## 2020-08-30 DIAGNOSIS — Z6823 Body mass index (BMI) 23.0-23.9, adult: Secondary | ICD-10-CM | POA: Diagnosis not present

## 2020-08-30 DIAGNOSIS — Z1231 Encounter for screening mammogram for malignant neoplasm of breast: Secondary | ICD-10-CM | POA: Diagnosis not present

## 2020-09-16 MED FILL — DUPIXENT 300 MG/2ML SOPN: 300 | 28 days supply | Qty: 4 | Fill #3

## 2020-09-30 ENCOUNTER — Other Ambulatory Visit (HOSPITAL_COMMUNITY): Payer: Self-pay

## 2020-10-08 ENCOUNTER — Other Ambulatory Visit (HOSPITAL_COMMUNITY): Payer: Self-pay

## 2020-10-18 ENCOUNTER — Other Ambulatory Visit: Payer: Self-pay | Admitting: Family Medicine

## 2020-10-18 DIAGNOSIS — F419 Anxiety disorder, unspecified: Secondary | ICD-10-CM

## 2020-10-19 ENCOUNTER — Other Ambulatory Visit (HOSPITAL_COMMUNITY): Payer: Self-pay

## 2020-10-19 MED ORDER — ESCITALOPRAM OXALATE 20 MG PO TABS
20.0000 mg | ORAL_TABLET | Freq: Every day | ORAL | 1 refills | Status: DC
  Filled 2020-10-19: qty 90, 90d supply, fill #0

## 2020-10-20 ENCOUNTER — Other Ambulatory Visit (HOSPITAL_COMMUNITY): Payer: Self-pay

## 2020-10-25 ENCOUNTER — Other Ambulatory Visit (HOSPITAL_COMMUNITY): Payer: Self-pay

## 2020-10-25 MED FILL — Dupilumab Subcutaneous Soln Auto-injector 300 MG/2ML: SUBCUTANEOUS | 28 days supply | Qty: 4 | Fill #0 | Status: AC

## 2020-11-16 ENCOUNTER — Other Ambulatory Visit: Payer: Self-pay

## 2020-11-16 ENCOUNTER — Other Ambulatory Visit (HOSPITAL_COMMUNITY): Payer: Self-pay

## 2020-11-17 ENCOUNTER — Other Ambulatory Visit (HOSPITAL_COMMUNITY): Payer: Self-pay

## 2020-11-18 ENCOUNTER — Other Ambulatory Visit: Payer: Self-pay | Admitting: Family Medicine

## 2020-11-18 ENCOUNTER — Other Ambulatory Visit (HOSPITAL_COMMUNITY): Payer: Self-pay

## 2020-11-23 ENCOUNTER — Other Ambulatory Visit: Payer: Self-pay | Admitting: Family Medicine

## 2020-11-23 ENCOUNTER — Other Ambulatory Visit: Payer: Self-pay | Admitting: Pharmacist

## 2020-11-23 ENCOUNTER — Other Ambulatory Visit (HOSPITAL_COMMUNITY): Payer: Self-pay

## 2020-11-23 MED ORDER — DUPILUMAB 300 MG/2ML ~~LOC~~ SOSY
PREFILLED_SYRINGE | SUBCUTANEOUS | 2 refills | Status: DC
  Filled 2020-11-23 – 2020-12-07 (×3): qty 2, 28d supply, fill #0

## 2020-11-23 MED ORDER — DUPILUMAB 300 MG/2ML ~~LOC~~ SOSY: PREFILLED_SYRINGE | SUBCUTANEOUS | 2 refills | Status: DC

## 2020-12-07 ENCOUNTER — Other Ambulatory Visit (HOSPITAL_COMMUNITY): Payer: Self-pay

## 2020-12-08 ENCOUNTER — Other Ambulatory Visit (HOSPITAL_COMMUNITY): Payer: Self-pay

## 2020-12-08 ENCOUNTER — Other Ambulatory Visit: Payer: Self-pay | Admitting: Pharmacist

## 2020-12-08 MED ORDER — DUPIXENT 300 MG/2ML ~~LOC~~ SOAJ: SUBCUTANEOUS | 4 refills | Status: DC

## 2020-12-08 MED ORDER — DUPIXENT 300 MG/2ML ~~LOC~~ SOAJ
SUBCUTANEOUS | 4 refills | Status: DC
  Filled 2020-12-08: qty 4, 28d supply, fill #0
  Filled 2021-01-18: qty 4, 28d supply, fill #1
  Filled 2021-02-16: qty 4, 28d supply, fill #2
  Filled 2021-03-16: qty 4, 28d supply, fill #3
  Filled 2021-04-12: qty 4, 28d supply, fill #4

## 2020-12-09 ENCOUNTER — Other Ambulatory Visit (HOSPITAL_COMMUNITY): Payer: Self-pay

## 2020-12-12 ENCOUNTER — Other Ambulatory Visit (HOSPITAL_COMMUNITY): Payer: Self-pay

## 2021-01-02 ENCOUNTER — Other Ambulatory Visit (HOSPITAL_COMMUNITY): Payer: Self-pay

## 2021-01-17 DIAGNOSIS — Z411 Encounter for cosmetic surgery: Secondary | ICD-10-CM | POA: Diagnosis not present

## 2021-01-18 ENCOUNTER — Other Ambulatory Visit (HOSPITAL_COMMUNITY): Payer: Self-pay

## 2021-01-27 ENCOUNTER — Encounter: Payer: 59 | Admitting: Family Medicine

## 2021-01-30 ENCOUNTER — Ambulatory Visit (INDEPENDENT_AMBULATORY_CARE_PROVIDER_SITE_OTHER): Payer: 59 | Admitting: Family Medicine

## 2021-01-30 ENCOUNTER — Other Ambulatory Visit: Payer: Self-pay

## 2021-01-30 ENCOUNTER — Encounter: Payer: Self-pay | Admitting: Family Medicine

## 2021-01-30 VITALS — BP 112/70 | HR 71 | Temp 98.4°F | Resp 18 | Ht 68.0 in | Wt 158.8 lb

## 2021-01-30 DIAGNOSIS — Z1211 Encounter for screening for malignant neoplasm of colon: Secondary | ICD-10-CM

## 2021-01-30 DIAGNOSIS — F419 Anxiety disorder, unspecified: Secondary | ICD-10-CM

## 2021-01-30 DIAGNOSIS — R911 Solitary pulmonary nodule: Secondary | ICD-10-CM | POA: Diagnosis not present

## 2021-01-30 DIAGNOSIS — H538 Other visual disturbances: Secondary | ICD-10-CM | POA: Diagnosis not present

## 2021-01-30 DIAGNOSIS — Z0000 Encounter for general adult medical examination without abnormal findings: Secondary | ICD-10-CM | POA: Diagnosis not present

## 2021-01-30 DIAGNOSIS — L209 Atopic dermatitis, unspecified: Secondary | ICD-10-CM | POA: Diagnosis not present

## 2021-01-30 LAB — COMPREHENSIVE METABOLIC PANEL
ALT: 8 U/L (ref 0–35)
AST: 16 U/L (ref 0–37)
Albumin: 4.1 g/dL (ref 3.5–5.2)
Alkaline Phosphatase: 54 U/L (ref 39–117)
BUN: 15 mg/dL (ref 6–23)
CO2: 30 mEq/L (ref 19–32)
Calcium: 9.3 mg/dL (ref 8.4–10.5)
Chloride: 104 mEq/L (ref 96–112)
Creatinine, Ser: 0.74 mg/dL (ref 0.40–1.20)
GFR: 97.94 mL/min (ref >60.00)
Glucose, Bld: 86 mg/dL (ref 70–99)
Potassium: 4.6 mEq/L (ref 3.5–5.1)
Sodium: 138 mEq/L (ref 135–145)
Total Bilirubin: 0.6 mg/dL (ref 0.2–1.2)
Total Protein: 6.8 g/dL (ref 6.0–8.3)

## 2021-01-30 LAB — LIPID PANEL
Cholesterol: 199 mg/dL (ref 0–200)
HDL: 51 mg/dL (ref >39.00)
LDL Cholesterol: 134 mg/dL — ABNORMAL HIGH (ref 0–99)
NonHDL: 148.06
Total CHOL/HDL Ratio: 4
Triglycerides: 70 mg/dL (ref 0.0–149.0)
VLDL: 14 mg/dL (ref 0.0–40.0)

## 2021-01-30 LAB — CBC WITH DIFFERENTIAL/PLATELET
Basophils Absolute: 0 10*3/uL (ref 0.0–0.1)
Basophils Relative: 0.6 % (ref 0.0–3.0)
Eosinophils Absolute: 0.2 10*3/uL (ref 0.0–0.7)
Eosinophils Relative: 4.7 % (ref 0.0–5.0)
HCT: 40.1 % (ref 36.0–46.0)
Hemoglobin: 13.3 g/dL (ref 12.0–15.0)
Lymphocytes Relative: 36 % (ref 12.0–46.0)
Lymphs Abs: 1.4 10*3/uL (ref 0.7–4.0)
MCHC: 33.2 g/dL (ref 30.0–36.0)
MCV: 89.9 fl (ref 78.0–100.0)
Monocytes Absolute: 0.3 10*3/uL (ref 0.1–1.0)
Monocytes Relative: 8.9 % (ref 3.0–12.0)
Neutro Abs: 1.9 10*3/uL (ref 1.4–7.7)
Neutrophils Relative %: 49.8 % (ref 43.0–77.0)
Platelets: 191 10*3/uL (ref 150.0–400.0)
RBC: 4.46 Mil/uL (ref 3.87–5.11)
RDW: 13 % (ref 11.5–15.5)
WBC: 3.8 10*3/uL — ABNORMAL LOW (ref 4.0–10.5)

## 2021-01-30 LAB — TSH: TSH: 1.75 u[IU]/mL (ref 0.35–5.50)

## 2021-01-30 MED ORDER — ESCITALOPRAM OXALATE 10 MG PO TABS: 10.0000 mg | ORAL_TABLET | Freq: Every day | ORAL | 3 refills | Status: DC

## 2021-01-30 NOTE — Assessment & Plan Note (Signed)
Refer to opth

## 2021-01-30 NOTE — Patient Instructions (Signed)
Preventive Care 68-45 Years Old, Female Preventive care refers to lifestyle choices and visits with your health care provider that can promote health and wellness. This includes: A yearly physical exam. This is also called an annual wellness visit. Regular dental and eye exams. Immunizations. Screening for certain conditions. Healthy lifestyle choices, such as: Eating a healthy diet. Getting regular exercise. Not using drugs or products that contain nicotine and tobacco. Limiting alcohol use. What can I expect for my preventive care visit? Physical exam Your health care provider will check your: Height and weight. These may be used to calculate your BMI (body mass index). BMI is a measurement that tells if you are at a healthy weight. Heart rate and blood pressure. Body temperature. Skin for abnormal spots. Counseling Your health care provider may ask you questions about your: Past medical problems. Family's medical history. Alcohol, tobacco, and drug use. Emotional well-being. Home life and relationship well-being. Sexual activity. Diet, exercise, and sleep habits. Work and work Statistician. Access to firearms. Method of birth control. Menstrual cycle. Pregnancy history. What immunizations do I need?  Vaccines are usually given at various ages, according to a schedule. Your health care provider will recommend vaccines for you based on your age, medicalhistory, and lifestyle or other factors, such as travel or where you work. What tests do I need? Blood tests Lipid and cholesterol levels. These may be checked every 5 years, or more often if you are over 37 years old. Hepatitis C test. Hepatitis B test. Screening Lung cancer screening. You may have this screening every year starting at age 30 if you have a 30-pack-year history of smoking and currently smoke or have quit within the past 15 years. Colorectal cancer screening. All adults should have this screening starting at  age 23 and continuing until age 3. Your health care provider may recommend screening at age 88 if you are at increased risk. You will have tests every 1-10 years, depending on your results and the type of screening test. Diabetes screening. This is done by checking your blood sugar (glucose) after you have not eaten for a while (fasting). You may have this done every 1-3 years. Mammogram. This may be done every 1-2 years. Talk with your health care provider about when you should start having regular mammograms. This may depend on whether you have a family history of breast cancer. BRCA-related cancer screening. This may be done if you have a family history of breast, ovarian, tubal, or peritoneal cancers. Pelvic exam and Pap test. This may be done every 3 years starting at age 79. Starting at age 54, this may be done every 5 years if you have a Pap test in combination with an HPV test. Other tests STD (sexually transmitted disease) testing, if you are at risk. Bone density scan. This is done to screen for osteoporosis. You may have this scan if you are at high risk for osteoporosis. Talk with your health care provider about your test results, treatment options,and if necessary, the need for more tests. Follow these instructions at home: Eating and drinking  Eat a diet that includes fresh fruits and vegetables, whole grains, lean protein, and low-fat dairy products. Take vitamin and mineral supplements as recommended by your health care provider. Do not drink alcohol if: Your health care provider tells you not to drink. You are pregnant, may be pregnant, or are planning to become pregnant. If you drink alcohol: Limit how much you have to 0-1 drink a day. Be aware  of how much alcohol is in your drink. In the [place] one drink equals one 12 oz bottle of beer (355 mL), one 5 oz glass of wine (148 mL), or one 1 oz glass of hard liquor (44 mL).  Lifestyle Take daily care of your teeth and  gums. Brush your teeth every morning and night with fluoride toothpaste. Floss one time each day. Stay active. Exercise for at least 30 minutes 5 or more days each week. Do not use any products that contain nicotine or tobacco, such as cigarettes, e-cigarettes, and chewing tobacco. If you need help quitting, ask your health care provider. Do not use drugs. If you are sexually active, practice safe sex. Use a condom or other form of protection to prevent STIs (sexually transmitted infections). If you do not wish to become pregnant, use a form of birth control. If you plan to become pregnant, see your health care provider for a prepregnancy visit. If told by your health care provider, take low-dose aspirin daily starting at age 29. Find healthy ways to cope with stress, such as: Meditation, yoga, or listening to music. Journaling. Talking to a trusted person. Spending time with friends and family. Safety Always wear your seat belt while driving or riding in a vehicle. Do not drive: If you have been drinking alcohol. Do not ride with someone who has been drinking. When you are tired or distracted. While texting. Wear a helmet and other protective equipment during sports activities. If you have firearms in your house, make sure you follow all gun safety procedures. What's next? Visit your health care provider once a year for an annual wellness visit. Ask your health care provider how often you should have your eyes and teeth checked. Stay up to date on all vaccines. This information is not intended to replace advice given to you by your health care provider. Make sure you discuss any questions you have with your healthcare provider. Document Revised: 03/29/2020 Document Reviewed: 03/06/2018 Elsevier Patient Education  2022 [employer].

## 2021-01-30 NOTE — Assessment & Plan Note (Signed)
Stable  Lower dose of lexapro at pt request  F/u prn

## 2021-01-30 NOTE — Assessment & Plan Note (Signed)
Stable No further w/u advised

## 2021-01-30 NOTE — Progress Notes (Signed)
Patient ID: Lydia Davis, female    DOB: Nov 23, 1975  Age: 45 y.o. MRN: [id number]    Subjective:  Subjective  HPI Mackensie Billen presents for a comprehensive physical examination today. She complains of blurred vision. She notes that her eye sights had worsen. She reports that she has difficulty seeing distanced objects. She states that she doesn't  have an optometrist.  She notes that her bilateral knee locks while exercising, however she states that it doesn't bother her.  She reports injecting Dupilumab 300 MG/2ML SOPN Q2weeks SQ. She notes that her rash had resolved. She denies any side effects form the injection. Pt is UTD with her dermatologist. She notes that she no longer takes 0.25 mg xanax BID PRN for anxiety. She reports only taking a few dose of xanax (1-2) since 07/31/2018.  She states taking 20 mg LEXAPRO PO QD for anxiety. Pt notes that she has taken a lower dose previously and has not tolerate the dose well.  She reports that she gets bloated when consuming salt and dairy-product. She notes that she has been consuming lactose free products.  She denies any chest pain, SOB, fever, abdominal pain, cough, chills, sore throat, dysuria, urinary incontinence, back pain, HA, or N/V/D at this time.   Review of Systems  Constitutional:  Negative for chills, fatigue and fever.  HENT:  Negative for ear pain, rhinorrhea, sinus pressure, sinus pain, sore throat and tinnitus.   Eyes:  Positive for visual disturbance (blured vision). Negative for pain.  Respiratory:  Negative for cough, shortness of breath and wheezing.   Cardiovascular:  Negative for chest pain.  Gastrointestinal:  Negative for abdominal pain, anal bleeding, constipation, diarrhea, nausea and vomiting.  Genitourinary:  Negative for flank pain.  Musculoskeletal:  Negative for back pain and neck pain.       (+) bilateral patella locking   Skin:  Negative for rash.  Neurological:  Negative for seizures, weakness,  light-headedness, numbness and headaches.   History Past Medical History:  Diagnosis Date   Anxiety    Eczema    History of TB skin testing    postive    She has a past surgical history that includes Cesarean section (2012) and Tonsillectomy (1994).   Her family history includes Diabetes in her maternal grandfather; Heart disease in an other family member; Hyperlipidemia in her paternal grandfather; Hypertension in her father; Psoriasis in her father.She reports that she has never smoked. She has never used smokeless tobacco. She reports current alcohol use. She reports that she does not use drugs.  Current Outpatient Medications on File Prior to Visit  Medication Sig Dispense Refill   cetirizine (ZYRTEC) 10 MG tablet Take 10 mg by mouth daily.     Dupilumab (DUPIXENT) 300 MG/2ML SOPN INJECT 1 PEN ('300MG'$ ) UNDER THE SKIN EVERY TWO WEEKS STARTING ON DAY 15 AS DIRECTED. 4 mL 4   levonorgestrel (MIRENA) 20 MCG/24HR IUD 1 each by Intrauterine route once.     No current facility-administered medications on file prior to visit.     Objective:  Objective  Physical Exam Vitals and nursing note reviewed.  Constitutional:      General: She is not in acute distress.    Appearance: Normal appearance. She is well-developed. She is not ill-appearing.  HENT:     Head: Normocephalic and atraumatic.     Right Ear: External ear normal.     Left Ear: External ear normal.     Nose: Nose normal.  Eyes:  General:        Right eye: No discharge.        Left eye: No discharge.     Extraocular Movements: Extraocular movements intact.     Pupils: Pupils are equal, round, and reactive to light.  Cardiovascular:     Rate and Rhythm: Normal rate and regular rhythm.     Pulses: Normal pulses.     Heart sounds: Normal heart sounds. No murmur heard.   No friction rub. No gallop.  Pulmonary:     Effort: Pulmonary effort is normal. No respiratory distress.     Breath sounds: Normal breath sounds. No  stridor. No wheezing, rhonchi or rales.  Chest:     Chest wall: No tenderness.  Abdominal:     General: Bowel sounds are normal. There is no distension.     Palpations: Abdomen is soft. There is no mass.     Tenderness: There is no abdominal tenderness. There is no guarding or rebound.     Hernia: No hernia is present.  Musculoskeletal:        General: Normal range of motion.     Cervical back: Normal range of motion and neck supple.     Right lower leg: No edema.     Left lower leg: No edema.  Skin:    General: Skin is warm and dry.  Neurological:     Mental Status: She is alert and oriented to person, place, and time.  Psychiatric:        Behavior: Behavior normal.        Thought Content: Thought content normal.   BP 112/70 (BP Location: Right Arm, Patient Position: Sitting, Cuff Size: Normal)   Pulse 71   Temp 98.4 F (36.9 C) (Oral)   Resp 18   Ht '5\' 8"'$  (1.727 m)   Wt 158 lb 12.8 oz (72 kg)   SpO2 97%   BMI 24.15 kg/m  Wt Readings from Last 3 Encounters:  01/30/21 158 lb 12.8 oz (72 kg)  01/28/20 157 lb (71.2 kg)  01/26/19 157 lb 3.2 oz (71.3 kg)     Lab Results  Component Value Date   WBC 5.4 01/28/2020   HGB 14.1 01/28/2020   HCT 41.6 01/28/2020   PLT 248.0 01/28/2020   GLUCOSE 88 01/28/2020   CHOL 229 (H) 01/28/2020   TRIG 79.0 01/28/2020   HDL 46.60 01/28/2020   LDLCALC 166 (H) 01/28/2020   ALT 6 01/28/2020   AST 15 01/28/2020   NA 137 01/28/2020   K 4.5 01/28/2020   CL 102 01/28/2020   CREATININE 0.87 01/28/2020   BUN 15 01/28/2020   CO2 31 01/28/2020   TSH 2.51 01/28/2020    CT Chest Wo Contrast  Result Date: 04/21/2019 CLINICAL DATA:  Follow-up lung nodules EXAM: CT CHEST WITHOUT CONTRAST TECHNIQUE: Multidetector CT imaging of the chest was performed following the standard protocol without IV contrast. COMPARISON:  CT chest angiogram, 07/04/2018 FINDINGS: Cardiovascular: No significant vascular findings. Normal heart size. No pericardial  effusion. Mediastinum/Nodes: No enlarged mediastinal, hilar, or axillary lymph nodes. Thyroid gland, trachea, and esophagus demonstrate no significant findings. Lungs/Pleura: No change in multiple small pulmonary nodules, the largest a subpleural nodule of the dependent right lower lobe measuring 6 mm (series 3, image 86), the remainder tiny subpleural nodules measuring 2 mm or smaller with additional, definitively benign small fissural nodules. There is an additional stable, more bandlike area of scarring or atelectasis in the dependent right lung base, the dominant  component measuring 1.0 by 0.8 cm (series 3, image 117). No pleural effusion or pneumothorax. Upper Abdomen: No acute abnormality. Musculoskeletal: No chest wall mass or suspicious bone lesions identified. IMPRESSION: Unchanged small pulmonary nodules. Additional follow-up CT at 18-24 months (from initial baseline scan) is considered optional for low-risk patients, but is recommended for high-risk patients. This recommendation follows the consensus statement: Guidelines for Management of Incidental Pulmonary Nodules Detected on CT Images: From the Fleischner Society 2017; Radiology 2017; 284:228-243. Electronically Signed   By: Eddie Candle M.D.   On: 04/21/2019 10:00     Assessment & Plan:  Plan    Meds ordered this encounter  Medications   escitalopram (LEXAPRO) 10 MG tablet    Sig: Take 1 tablet (10 mg total) by mouth daily.    Dispense:  90 tablet    Refill:  3    Problem List Items Addressed This Visit       Unprioritized   AD (atopic dermatitis)    Per derm       Anxiety    Stable  Lower dose of lexapro at pt request  F/u prn        Relevant Medications   escitalopram (LEXAPRO) 10 MG tablet   Blurry vision, bilateral    Refer to opth       Relevant Orders   Ambulatory referral to Ophthalmology   Preventative health care - Primary    ghm  utd checkl labs See avs       Relevant Orders   Lipid panel    CBC with Differential/Platelet   Comprehensive metabolic panel   TSH   Pulmonary nodule    Stable No further w/u advised        Other Visit Diagnoses     Colon cancer screening       Relevant Orders   Ambulatory referral to Gastroenterology      Pap Smear: Last completed on 01/06/2014, repeat every 3 years.    Follow-up: Return in about 1 year (around 01/30/2022), or if symptoms worsen or fail to improve, for annual exam, fasting.   I,Gordon Zheng,acting as a Education administrator for [employer], DO.,have documented all relevant documentation on the behalf of Ann Held, DO,as directed by  Ann Held, DO while in the presence of [place], DO, have reviewed all documentation for this visit. The documentation on 01/30/21 for the exam, diagnosis, procedures, and orders are all accurate and complete.

## 2021-01-30 NOTE — Assessment & Plan Note (Signed)
Per derm 

## 2021-01-30 NOTE — Assessment & Plan Note (Signed)
ghm  utd checkl labs See avs

## 2021-01-31 ENCOUNTER — Other Ambulatory Visit (HOSPITAL_COMMUNITY): Payer: Self-pay

## 2021-02-08 ENCOUNTER — Other Ambulatory Visit (HOSPITAL_COMMUNITY): Payer: Self-pay

## 2021-02-16 ENCOUNTER — Other Ambulatory Visit (HOSPITAL_COMMUNITY): Payer: Self-pay

## 2021-02-23 DIAGNOSIS — Z86018 Personal history of other benign neoplasm: Secondary | ICD-10-CM | POA: Diagnosis not present

## 2021-02-23 DIAGNOSIS — L918 Other hypertrophic disorders of the skin: Secondary | ICD-10-CM | POA: Diagnosis not present

## 2021-02-23 DIAGNOSIS — D225 Melanocytic nevi of trunk: Secondary | ICD-10-CM | POA: Diagnosis not present

## 2021-02-23 DIAGNOSIS — D2272 Melanocytic nevi of left lower limb, including hip: Secondary | ICD-10-CM | POA: Diagnosis not present

## 2021-02-23 DIAGNOSIS — D2239 Melanocytic nevi of other parts of face: Secondary | ICD-10-CM | POA: Diagnosis not present

## 2021-02-23 DIAGNOSIS — D2271 Melanocytic nevi of right lower limb, including hip: Secondary | ICD-10-CM | POA: Diagnosis not present

## 2021-02-23 DIAGNOSIS — L578 Other skin changes due to chronic exposure to nonionizing radiation: Secondary | ICD-10-CM | POA: Diagnosis not present

## 2021-02-23 DIAGNOSIS — D485 Neoplasm of uncertain behavior of skin: Secondary | ICD-10-CM | POA: Diagnosis not present

## 2021-02-23 DIAGNOSIS — L814 Other melanin hyperpigmentation: Secondary | ICD-10-CM | POA: Diagnosis not present

## 2021-03-07 ENCOUNTER — Encounter: Payer: Self-pay | Admitting: Family Medicine

## 2021-03-16 ENCOUNTER — Other Ambulatory Visit (HOSPITAL_COMMUNITY): Payer: Self-pay

## 2021-03-21 ENCOUNTER — Other Ambulatory Visit (HOSPITAL_COMMUNITY): Payer: Self-pay

## 2021-04-11 IMAGING — CT CT CHEST W/O CM
2 of 3 series · 15 of 36 positions shown, 18 images · non-contrast
Comparison: CT chest angiogram, 07/04/2018

CLINICAL DATA: Follow-up lung nodules

EXAM:
CT CHEST WITHOUT CONTRAST
TECHNIQUE: Multidetector CT imaging of the chest was performed following the
standard protocol without IV contrast.

[Series 2: thorax · axial · 0.65mm/px · z∈[-89,+165]mm · 12 of 151 slices shown, 15 images]
[im 12/151  mediastinal]
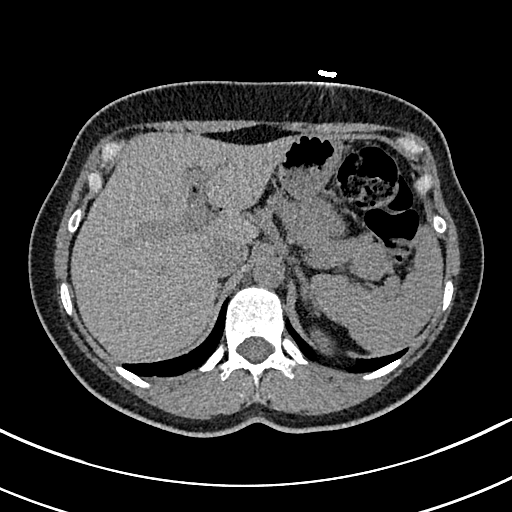
[im 12/151  lung]
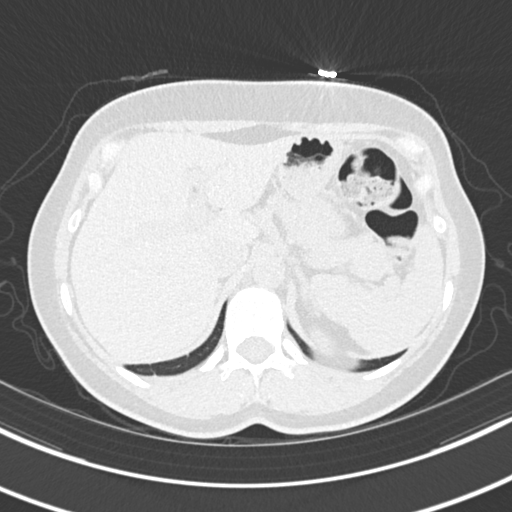
[im 23/151  lung]
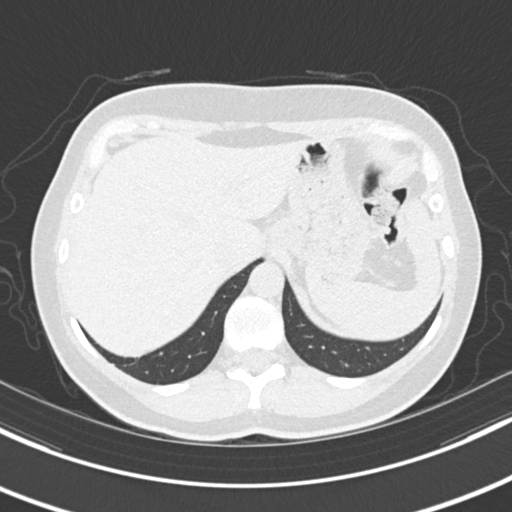
[im 34/151  lung]
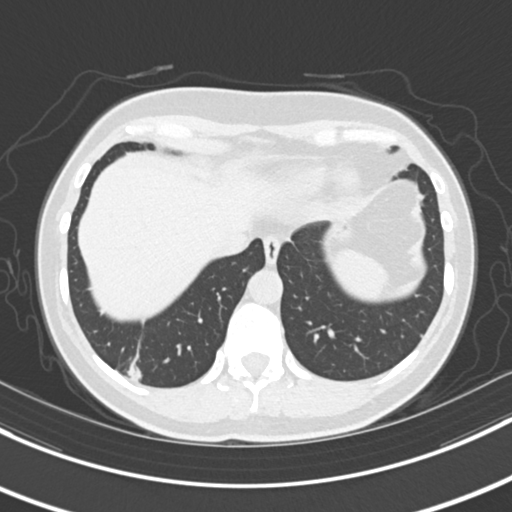
[im 45/151  lung]
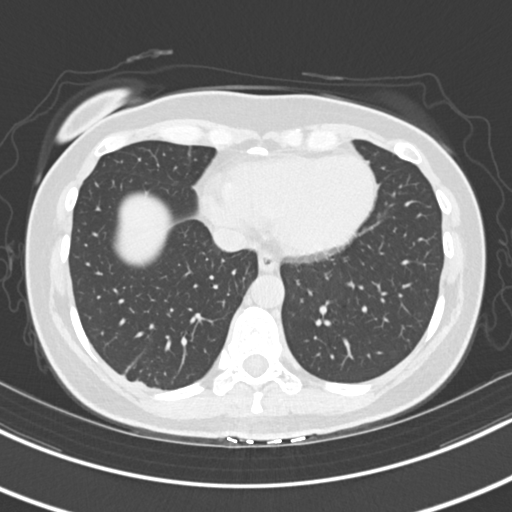
[im 56/151  mediastinal]
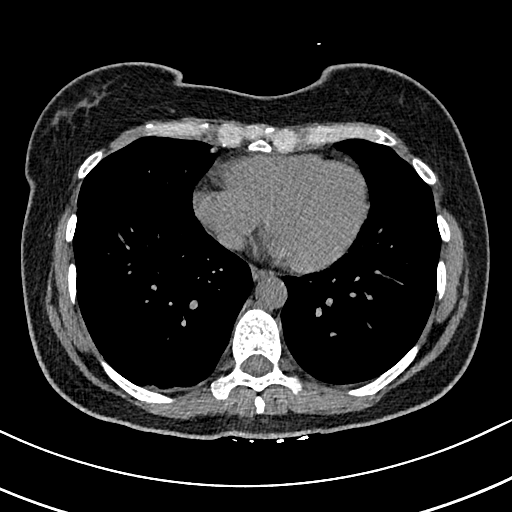
[im 56/151  lung]
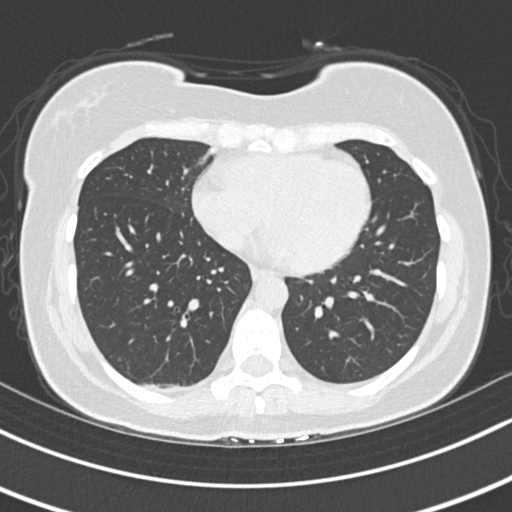
[im 67/151  lung]
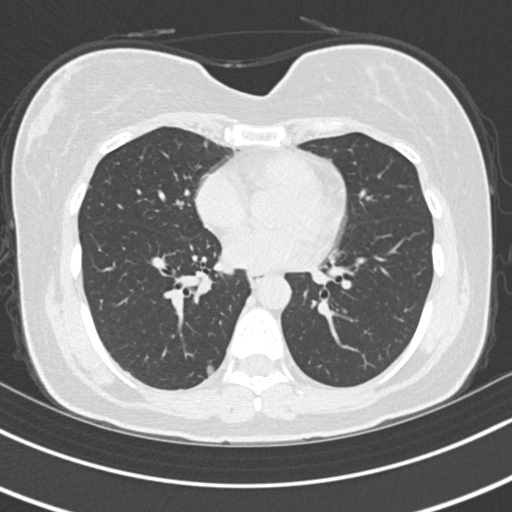
[im 84/151  lung]
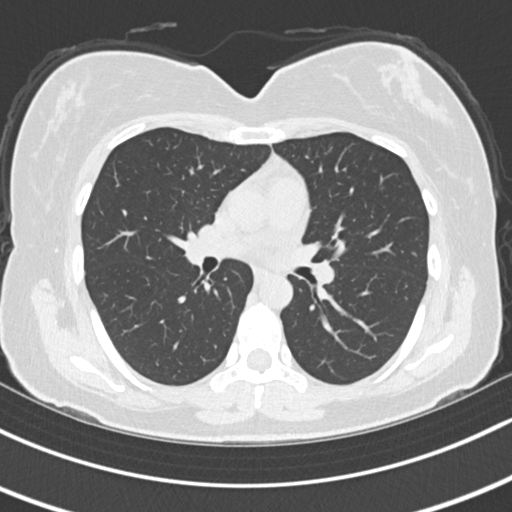
[im 95/151  lung]
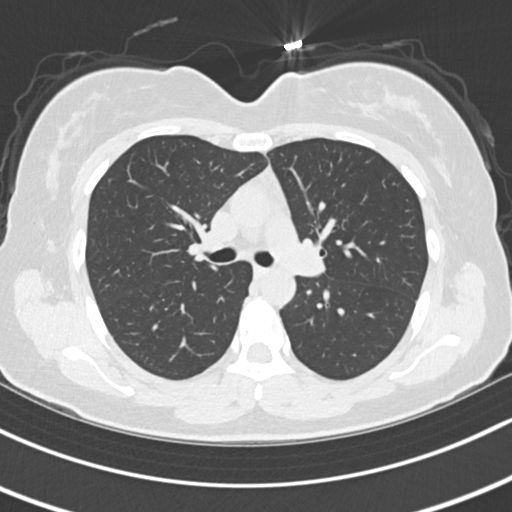
[im 106/151  mediastinal]
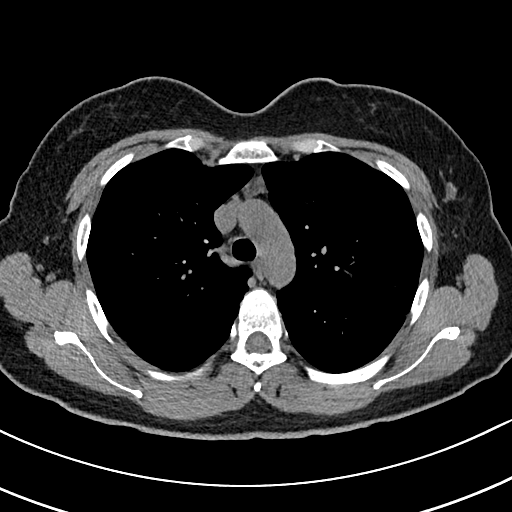
[im 106/151  lung]
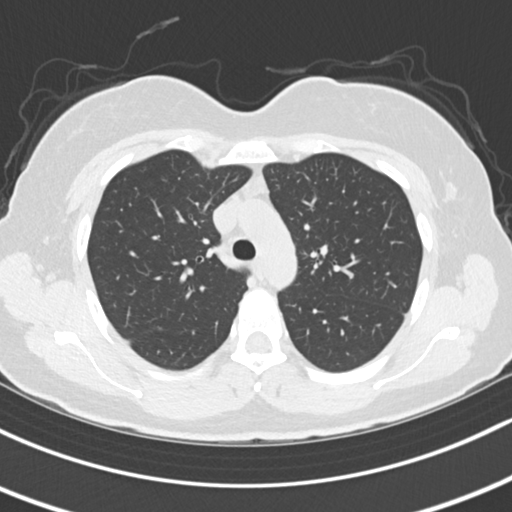
[im 117/151  lung]
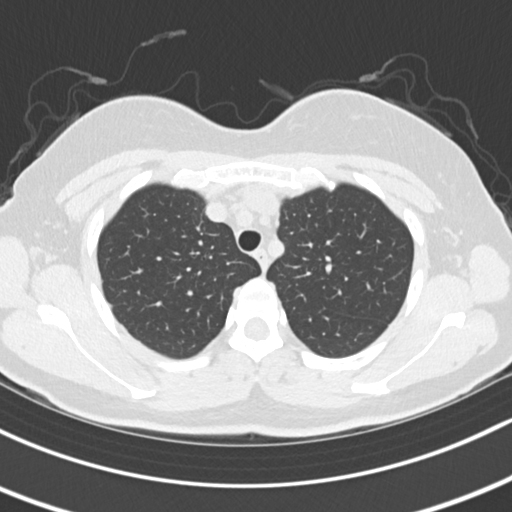
[im 128/151  lung]
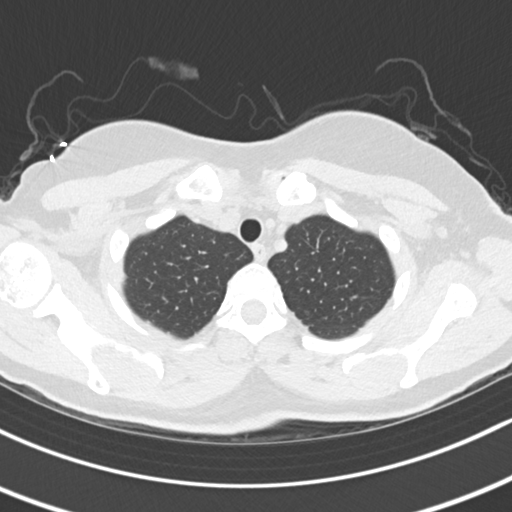
[im 139/151  lung]
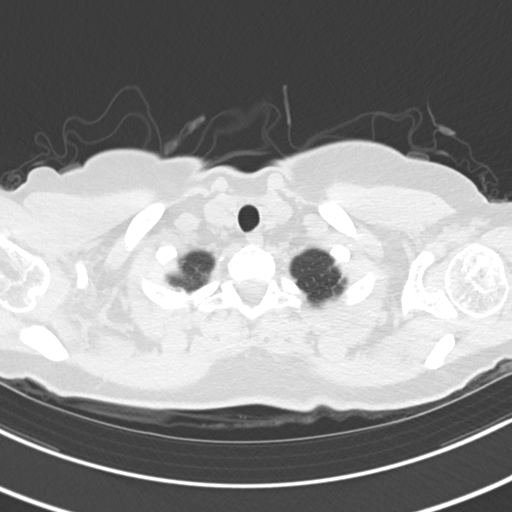

[Series 5: coronal · coronal · 0.62mm/px · 3 of 151 slices shown]
[im 31/151  lung]
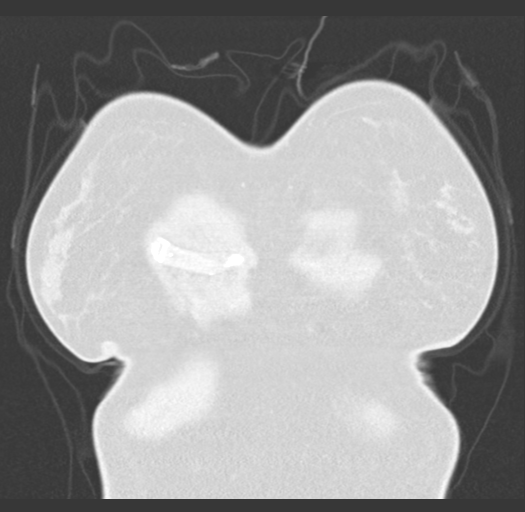
[im 61/151  lung]
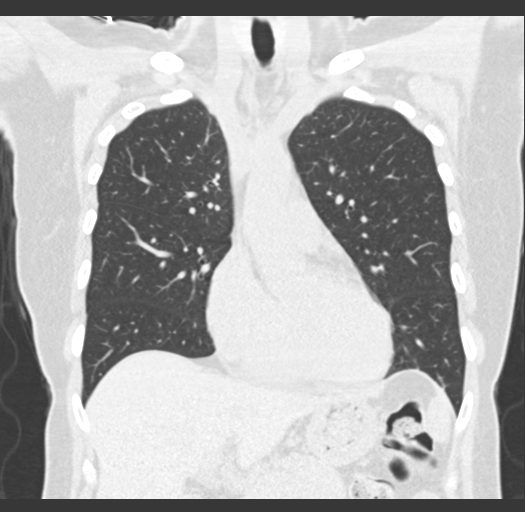
[im 91/151  lung]
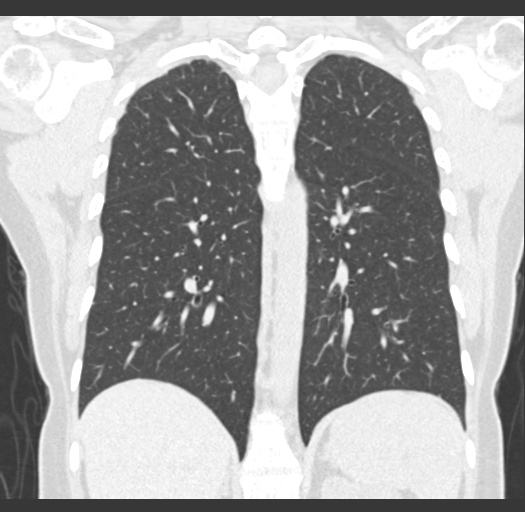

[15 of 36 positions shown; findings below may reference images not displayed]

FINDINGS: Cardiovascular: No significant vascular findings. Normal heart size.
No pericardial effusion.

Mediastinum/Nodes: No enlarged mediastinal, hilar, or axillary lymph
nodes. Thyroid gland, trachea, and esophagus demonstrate no
significant findings.

Lungs/Pleura: No change in multiple small pulmonary nodules, the
largest a subpleural nodule of the dependent right lower lobe
measuring 6 mm (series 3, image 86), the remainder tiny subpleural
nodules measuring 2 mm or smaller with additional, definitively
benign small fissural nodules. There is an additional stable, more
bandlike area of scarring or atelectasis in the dependent right lung
base, the dominant component measuring 1.0 by 0.8 cm (series 3,
image 117). No pleural effusion or pneumothorax.

Upper Abdomen: No acute abnormality.

Musculoskeletal: No chest wall mass or suspicious bone lesions
identified.
IMPRESSION: Unchanged small pulmonary nodules. Additional follow-up CT at 18-24
months (from initial baseline scan) is considered optional for
low-risk patients, but is recommended for high-risk patients. This
recommendation follows the consensus statement: Guidelines for
Management of Incidental Pulmonary Nodules Detected on CT Images:

## 2021-04-12 ENCOUNTER — Other Ambulatory Visit (HOSPITAL_COMMUNITY): Payer: Self-pay

## 2021-04-13 DIAGNOSIS — H538 Other visual disturbances: Secondary | ICD-10-CM | POA: Diagnosis not present

## 2021-04-13 DIAGNOSIS — H0288B Meibomian gland dysfunction left eye, upper and lower eyelids: Secondary | ICD-10-CM | POA: Diagnosis not present

## 2021-04-13 DIAGNOSIS — H04123 Dry eye syndrome of bilateral lacrimal glands: Secondary | ICD-10-CM | POA: Diagnosis not present

## 2021-04-13 DIAGNOSIS — H0288A Meibomian gland dysfunction right eye, upper and lower eyelids: Secondary | ICD-10-CM | POA: Diagnosis not present

## 2021-04-17 ENCOUNTER — Other Ambulatory Visit (HOSPITAL_COMMUNITY): Payer: Self-pay

## 2021-05-12 ENCOUNTER — Other Ambulatory Visit (HOSPITAL_COMMUNITY): Payer: Self-pay

## 2021-05-15 ENCOUNTER — Other Ambulatory Visit: Payer: Self-pay | Admitting: Pharmacist

## 2021-05-15 ENCOUNTER — Other Ambulatory Visit (HOSPITAL_COMMUNITY): Payer: Self-pay

## 2021-05-15 MED ORDER — DUPIXENT 300 MG/2ML ~~LOC~~ SOAJ: SUBCUTANEOUS | 4 refills | Status: DC

## 2021-05-15 MED ORDER — DUPIXENT 300 MG/2ML ~~LOC~~ SOAJ
SUBCUTANEOUS | 4 refills | Status: DC
  Filled 2021-05-15: qty 4, 28d supply, fill #0
  Filled 2021-06-16: qty 4, 28d supply, fill #1
  Filled 2021-07-17: qty 4, 28d supply, fill #2
  Filled 2021-08-07: qty 4, 28d supply, fill #3
  Filled 2021-09-04: qty 4, 28d supply, fill #4

## 2021-05-17 ENCOUNTER — Other Ambulatory Visit (HOSPITAL_COMMUNITY): Payer: Self-pay

## 2021-06-14 ENCOUNTER — Other Ambulatory Visit (HOSPITAL_COMMUNITY): Payer: Self-pay

## 2021-06-16 ENCOUNTER — Other Ambulatory Visit (HOSPITAL_COMMUNITY): Payer: Self-pay

## 2021-06-19 ENCOUNTER — Other Ambulatory Visit (HOSPITAL_COMMUNITY): Payer: Self-pay

## 2021-06-29 ENCOUNTER — Other Ambulatory Visit: Payer: Self-pay

## 2021-06-29 ENCOUNTER — Ambulatory Visit: Payer: 59 | Attending: Family Medicine | Admitting: Pharmacist

## 2021-06-29 ENCOUNTER — Encounter: Payer: Self-pay | Admitting: Pharmacist

## 2021-06-29 DIAGNOSIS — Z79899 Other long term (current) drug therapy: Secondary | ICD-10-CM

## 2021-06-29 NOTE — Progress Notes (Signed)
° °  S: Patient presents for review of their specialty medication therapy.   Patient is currently taking Dupixent for atopic dermatitis. Patient is managed by Dr. Renda Rolls for this.   Adherence: confirmed  Efficacy: reports that it works well for her   Dosing: 600 mg x1 followed by 300mg  q14days  Dose adjustments: Renal: no dose adjustments (has not been studied) Hepatic: no dose adjustments (has not been studied)  Drug-drug interactions: none identified  Monitoring: S/sx of infection: none S/sx of hypersensitivity: none S/sx of ocular effects: none  S/sx of eosinophilia/vasculitis: none  O:     Lab Results  Component Value Date   WBC 3.8 (L) 01/30/2021   HGB 13.3 01/30/2021   HCT 40.1 01/30/2021   MCV 89.9 01/30/2021   PLT 191.0 01/30/2021      Chemistry      Component Value Date/Time   NA 138 01/30/2021 0914   K 4.6 01/30/2021 0914   CL 104 01/30/2021 0914   CO2 30 01/30/2021 0914   BUN 15 01/30/2021 0914   CREATININE 0.74 01/30/2021 0914      Component Value Date/Time   CALCIUM 9.3 01/30/2021 0914   ALKPHOS 54 01/30/2021 0914   AST 16 01/30/2021 0914   ALT 8 01/30/2021 0914   BILITOT 0.6 01/30/2021 0914       A/P: 1. Medication review: Patient currently on [place] for atopic dermatitis. Reviewed the medication with the patient, including the following: Dupixent is a monoclonal antibody used for the treatment of asthma or atopic dermatitis. Patient educated on purpose, proper use and potential adverse effects of Dupixent. Possible adverse effects include increased risk of infection, ocular effects, vasculitis/eosinophilia, and hypersensitivity reactions. Administer as a SubQ injection and rotate sites. Allow the medication to reach room temp prior to administration (45 mins for 300 mg syringe or 30 min for 200 mg syringe). Do not shake. Discard any unused portion. No recommendations for any changes.   Benard Halsted, PharmD, [facility] [place], [place] [phone]

## 2021-07-17 ENCOUNTER — Other Ambulatory Visit (HOSPITAL_COMMUNITY): Payer: Self-pay

## 2021-08-07 ENCOUNTER — Other Ambulatory Visit (HOSPITAL_COMMUNITY): Payer: Self-pay

## 2021-08-09 ENCOUNTER — Encounter: Payer: Self-pay | Admitting: Family Medicine

## 2021-08-09 ENCOUNTER — Other Ambulatory Visit: Payer: Self-pay | Admitting: Family Medicine

## 2021-08-09 ENCOUNTER — Other Ambulatory Visit (HOSPITAL_COMMUNITY): Payer: Self-pay

## 2021-08-09 DIAGNOSIS — F419 Anxiety disorder, unspecified: Secondary | ICD-10-CM

## 2021-08-09 MED ORDER — ESCITALOPRAM OXALATE 10 MG PO TABS: 10.0000 mg | ORAL_TABLET | Freq: Every day | ORAL | 3 refills | Status: DC

## 2021-08-09 MED ORDER — ESCITALOPRAM OXALATE 10 MG PO TABS
10.0000 mg | ORAL_TABLET | Freq: Every day | ORAL | 3 refills | Status: DC
  Filled 2021-08-09: qty 90, 90d supply, fill #0

## 2021-08-09 NOTE — Telephone Encounter (Signed)
Pt last seen in 07/22 for CPE. Would you like a office visit or refill and follow up?

## 2021-08-10 ENCOUNTER — Other Ambulatory Visit (HOSPITAL_COMMUNITY): Payer: Self-pay

## 2021-08-31 DIAGNOSIS — Z6823 Body mass index (BMI) 23.0-23.9, adult: Secondary | ICD-10-CM | POA: Diagnosis not present

## 2021-08-31 DIAGNOSIS — Z1231 Encounter for screening mammogram for malignant neoplasm of breast: Secondary | ICD-10-CM | POA: Diagnosis not present

## 2021-08-31 DIAGNOSIS — Z01419 Encounter for gynecological examination (general) (routine) without abnormal findings: Secondary | ICD-10-CM | POA: Diagnosis not present

## 2021-09-04 ENCOUNTER — Other Ambulatory Visit (HOSPITAL_COMMUNITY): Payer: Self-pay

## 2021-09-11 ENCOUNTER — Other Ambulatory Visit (HOSPITAL_COMMUNITY): Payer: Self-pay

## 2021-09-19 ENCOUNTER — Other Ambulatory Visit (HOSPITAL_COMMUNITY): Payer: Self-pay

## 2021-09-19 ENCOUNTER — Encounter: Payer: Self-pay | Admitting: Family Medicine

## 2021-09-19 ENCOUNTER — Ambulatory Visit: Payer: 59 | Admitting: Family Medicine

## 2021-09-19 VITALS — BP 118/72 | HR 66 | Temp 98.2°F | Resp 16 | Ht 68.0 in | Wt 158.0 lb

## 2021-09-19 DIAGNOSIS — F419 Anxiety disorder, unspecified: Secondary | ICD-10-CM

## 2021-09-19 DIAGNOSIS — Z1211 Encounter for screening for malignant neoplasm of colon: Secondary | ICD-10-CM | POA: Diagnosis not present

## 2021-09-19 DIAGNOSIS — R079 Chest pain, unspecified: Secondary | ICD-10-CM

## 2021-09-19 DIAGNOSIS — F410 Panic disorder [episodic paroxysmal anxiety] without agoraphobia: Secondary | ICD-10-CM

## 2021-09-19 MED ORDER — ALPRAZOLAM 0.25 MG PO TABS
0.2500 mg | ORAL_TABLET | Freq: Two times a day (BID) | ORAL | 0 refills | Status: DC | PRN
  Filled 2021-09-19: qty 20, 10d supply, fill #0

## 2021-09-19 NOTE — Progress Notes (Signed)
? ?Subjective:  ? ?By signing my name below, I, Shehryar Baig, attest that this documentation has been prepared under the direction and in the presence of Dr. Roma Schanz, DO. 09/19/2021 ? ? ? Patient ID: Lydia Davis, female    DOB: 08-Sep-1975, 46 y.o.   MRN: [id number] ? ?Chief Complaint  ?Patient presents with  ? Medication Refill  ?  Here for discuss Medication   ? ? ?Medication Refill ? ?Patient is in today for a office visit.  ? ?She reports having increased anxiety recently. While having episodes of anxiety she reports feeling tightness in her chest that last for a few minutes. Her prior episodes of anxiety did not have associated chest tightness. She finds relaxing and deep breathing help her symptoms while having an episode. She recently started exercising in the gym regularly and reports it helps her anxiety. She continues taking 10 mg lexapro daily PO and reports doing well while taking it.  ?She reports her paternal grandfather died from a heart attack at age 2. She reports none of her grandparents had a heart attack before age 51.  ?She continues taking '300mg'$ /33m dupixent and reports no new issues while taking it.  ?She has the flu vaccine this year. She reports having 2 Covid-19 vaccines at this time.  ?She is interested to set up an appointment for a colonoscopy.  ? ? ?Past Medical History:  ?Diagnosis Date  ? Anxiety   ? Eczema   ? History of TB skin testing   ? postive  ? ? ?Past Surgical History:  ?Procedure Laterality Date  ? CESAREAN SECTION  2012  ? TONSILLECTOMY  1994  ? ? ?Family History  ?Problem Relation Age of Onset  ? Hypertension Father   ?     and both sets of grandparents  ? Psoriasis Father   ? Diabetes Maternal Grandfather   ? Hyperlipidemia Paternal Grandfather   ? Heart disease Other   ?     both sets of grandparents  ? ? ?Social History  ? ?Socioeconomic History  ? Marital status: Married  ?  Spouse name: Not on file  ? Number of children: 1  ? Years of education: Not on  file  ? Highest education level: Not on file  ?Occupational History  ? Occupation: REquities trader ?  Employer: [employer]  ?  Comment[place] in the [place] ?Tobacco Use  ? Smoking status: Never  ? Smokeless tobacco: Never  ?Substance and Sexual Activity  ? Alcohol use: Yes  ?  Alcohol/week: 0.0 standard drinks  ?  Comment: occasionally---glass of wine  ? Drug use: No  ? Sexual activity: Yes  ?  Partners: Male  ?  Birth control/protection: I.U.D.  ?Other Topics Concern  ? Not on file  ?Social History Narrative  ? Exercise--Walking  ? ?Social Determinants of Health  ? ?Financial Resource Strain: Not on file  ?Food Insecurity: Not on file  ?Transportation Needs: Not on file  ?Physical Activity: Not on file  ?Stress: Not on file  ?Social Connections: Not on file  ?Intimate Partner Violence: Not on file  ? ? ?Outpatient Medications Prior to Visit  ?Medication Sig Dispense Refill  ? cetirizine (ZYRTEC) 10 MG tablet Take 10 mg by mouth daily.    ? Dupilumab (DUPIXENT) 300 MG/2ML SOPN INJECT 1 PEN ('300MG'$ ) UNDER THE SKIN EVERY TWO WEEKS STARTING ON DAY 15 AS DIRECTED. 4 mL 4  ? escitalopram (LEXAPRO) 10 MG tablet Take 1 tablet (10 mg total)  by mouth daily. 90 tablet 3  ? levonorgestrel (MIRENA) 20 MCG/24HR IUD 1 each by Intrauterine route once.    ? ?No facility-administered medications prior to visit.  ? ? ?Allergies  ?Allergen Reactions  ? Augmentin [Amoxicillin-Pot Clavulanate] Rash  ? ? ?Review of Systems  ?Cardiovascular:   ?     (+)chest tightness during episodes of anxiety  ?Psychiatric/Behavioral:    ?     (+)anxiety  ? ?   ?Objective:  ?  ?Physical Exam ?Constitutional:   ?   General: She is not in acute distress. ?   Appearance: Normal appearance. She is not ill-appearing.  ?HENT:  ?   Head: Normocephalic and atraumatic.  ?   Right Ear: External ear normal.  ?   Left Ear: External ear normal.  ?Eyes:  ?   Extraocular Movements: Extraocular movements intact.  ?   Pupils: Pupils are equal, round, and reactive to  light.  ?Cardiovascular:  ?   Rate and Rhythm: Normal rate and regular rhythm.  ?   Heart sounds: Normal heart sounds. No murmur heard. ?  No gallop.  ?Pulmonary:  ?   Effort: Pulmonary effort is normal. No respiratory distress.  ?   Breath sounds: Normal breath sounds. No wheezing or rales.  ?Skin: ?   General: Skin is warm and dry.  ?Neurological:  ?   Mental Status: She is alert and oriented to person, place, and time.  ?Psychiatric:     ?   Judgment: Judgment normal.  ? ? ?There were no vitals taken for this visit. ?Wt Readings from Last 3 Encounters:  ?01/30/21 158 lb 12.8 oz (72 kg)  ?01/28/20 157 lb (71.2 kg)  ?01/26/19 157 lb 3.2 oz (71.3 kg)  ? ? ?Diabetic Foot Exam - Simple   ?No data filed ?  ? ?Lab Results  ?Component Value Date  ? WBC 3.8 (L) 01/30/2021  ? HGB 13.3 01/30/2021  ? HCT 40.1 01/30/2021  ? PLT 191.0 01/30/2021  ? GLUCOSE 86 01/30/2021  ? CHOL 199 01/30/2021  ? TRIG 70.0 01/30/2021  ? HDL 51.00 01/30/2021  ? LDLCALC 134 (H) 01/30/2021  ? ALT 8 01/30/2021  ? AST 16 01/30/2021  ? NA 138 01/30/2021  ? K 4.6 01/30/2021  ? CL 104 01/30/2021  ? CREATININE 0.74 01/30/2021  ? BUN 15 01/30/2021  ? CO2 30 01/30/2021  ? TSH 1.75 01/30/2021  ? ? ?Lab Results  ?Component Value Date  ? TSH 1.75 01/30/2021  ? ?Lab Results  ?Component Value Date  ? WBC 3.8 (L) 01/30/2021  ? HGB 13.3 01/30/2021  ? HCT 40.1 01/30/2021  ? MCV 89.9 01/30/2021  ? PLT 191.0 01/30/2021  ? ?Lab Results  ?Component Value Date  ? NA 138 01/30/2021  ? K 4.6 01/30/2021  ? CO2 30 01/30/2021  ? GLUCOSE 86 01/30/2021  ? BUN 15 01/30/2021  ? CREATININE 0.74 01/30/2021  ? BILITOT 0.6 01/30/2021  ? ALKPHOS 54 01/30/2021  ? AST 16 01/30/2021  ? ALT 8 01/30/2021  ? PROT 6.8 01/30/2021  ? ALBUMIN 4.1 01/30/2021  ? CALCIUM 9.3 01/30/2021  ? ANIONGAP 10 08/02/2018  ? GFR 97.94 01/30/2021  ? ?Lab Results  ?Component Value Date  ? CHOL 199 01/30/2021  ? ?Lab Results  ?Component Value Date  ? HDL 51.00 01/30/2021  ? ?Lab Results  ?Component Value  Date  ? LDLCALC 134 (H) 01/30/2021  ? ?Lab Results  ?Component Value Date  ? TRIG 70.0 01/30/2021  ? ?Lab  Results  ?Component Value Date  ? CHOLHDL 4 01/30/2021  ? ?No results found for: HGBA1C ? ?  Ek=== NSR  ?Assessment & Plan:  ? ?Problem List Items Addressed This Visit   ?None ? ? ? ?No orders of the defined types were placed in this encounter. ? ? ?I, Radiation protection practitioner, personally preformed the services described in this documentation.  All medical record entries made by the scribe were at my direction and in my presence.  I have reviewed the chart and discharge instructions (if applicable) and agree that the record reflects my personal performance and is accurate and complete. 09/19/2021 ? ? ?Engineering geologist as a Education administrator for [employer], DO.,have documented all relevant documentation on the behalf of Ann Held, DO,as directed by  Ann Held, DO while in the presence of Ann Held, DO. ? ? ?Shehryar Baig ? ?

## 2021-09-19 NOTE — Patient Instructions (Signed)
Panic Attack ?A panic attack is a sudden episode of severe anxiety, fear, or discomfort that causes physical and emotional symptoms. A panic attack may be in response to something frightening, or it may occur for no known reason. ?Symptoms of a panic attack can be similar to symptoms of a heart attack or stroke. It is important to see your health care provider when you have a panic attack so that these conditions can be ruled out. ?What are the causes? ?A panic attack may be caused by: ?An extreme, life-threatening situation, such as a war or natural disaster. ?An anxiety disorder, such as post-traumatic stress disorder. ?Depression. ?Panic disorder. ?Certain medical conditions, including heart problems, neurological conditions, and infections. ?Other causes may include: ?Certain over-the-counter and prescription medicines. ?Supplements that increase anxiety. ?Illegal drugs that increase heart rate and blood pressure, such as methamphetamine. ?What increases the risk? ?You are more likely to develop this condition if: ?You have another mental health condition. ?You use alcohol, illegal drugs, or other substances. ?You are under extreme stress. ?A life event is causing increased feelings of anxiety and depression. ?What are the signs or symptoms? ?A panic attack starts suddenly, usually lasts 5-10 minutes, and occurs with one or more of the following: ?A pounding heart, or a feeling that your heart is beating irregularly or faster than normal (palpitations). ?Sweating, trembling, or shaking. ?Shortness of breath, feeling smothered, or feeling choked. ?Chest pain or discomfort. ?Nausea or a strange feeling in your stomach. ?Dizziness, feeling light-headed, or feeling like you might faint. ?Other symptoms may include: ?Chills or hot flashes. ?Numbness or tingling in your lips, hands, or feet. ?Feeling confused, or feeling that you are not yourself. ?Fear of losing control or of being emotionally unstable, or fear of  dying. ?How is this diagnosed? ?A panic attack is diagnosed with an assessment by your health care provider. During the assessment, your health care provider will ask questions about: ?Your history of anxiety, depression, and panic attacks. ?Your medical history. ?Whether you drink alcohol, use drugs, take supplements, or take medicines. Be honest about your substance use. ?Your health care provider may also: ?Order blood tests or other kinds of tests to rule out serious medical conditions. ?Refer you to a mental health professional for further evaluation. ?How is this treated? ?A panic attack is a symptom of another condition. Treatment depends on the cause of the panic attack. ?If the cause is a medical problem, your health care provider will treat that problem or refer you to a specialist. ?If the cause is emotional, you may be given anti-anxiety medicines or referred to a counselor. Anti-anxiety medicines may reduce how often attacks happen, reduce how severe the attacks are, and lower anxiety. ?If the cause is a medicine, your health care provider may tell you to stop the medicine, change your dose, or take a different medicine. ?If the cause is an illegal drug, treatment may involve letting the drug wear off and taking medicine to help the drug leave your body or to stop its effects. Attacks caused by heavy drug use may continue even if you stop using the drug. ?Most panic attacks go away with treatment of the underlying problem. If you have panic attacks often, you may have a condition called panic disorder. ?Follow these instructions at home: ?Alcohol use ?Do not drink alcohol if: ?Your health care provider tells you not to drink. ?You are pregnant, may be pregnant, or are planning to become pregnant. ?If you drink alcohol: ?Limit how much   you have to: ?0-1 drink a day for women. ?0-2 drinks a day for men. ?Know how much alcohol is in your drink. In the [place] one drink equals one 12 oz bottle of beer (355  mL), one 5 oz glass of wine (148 mL), or one 1? oz glass of hard liquor (44 mL). ?General instructions ?Take over-the-counter and prescription medicines only as told by your health care provider. ?If you feel anxious, limit your caffeine intake. ?Take good care of your physical and mental health by: ?Eating a balanced diet that includes plenty of fresh fruits and vegetables, whole grains, lean meats, and low-fat dairy. ?Getting plenty of rest. Try to get 7-8 hours of uninterrupted sleep each night. ?Exercising regularly. Try to get 30 minutes of physical activity at least 5 days a week. ?Do not use any products that contain nicotine or tobacco. These products include cigarettes, chewing tobacco, and vaping devices, such as e-cigarettes. If you need help quitting, ask your health care provider. ?Keep all follow-up visits. This is important. Panic attacks may have underlying physical or emotional problems that take time to accurately diagnose. ?Where to find more information ?Substance Abuse [facility] Mental [facility]): SamedayNews.com.cy ?[facility]): https://carter.com/ ?Contact a health care provider if: ?Your symptoms do not improve, or they get worse. ?You are not able to take your medicine as prescribed because of side effects. ?Get help right away if: ?You have thoughts about hurting yourself or others. ?Get help right away if you feel like you may hurt yourself or others, or have thoughts about taking your own life. Go to your nearest emergency room or: ?Call 911. ?Call the [place] at 5405219989 or 988. This is open 24 hours a day. ?Text the Crisis Text Line at 2033441464. ?Summary ?A panic attack is a sudden episode of severe anxiety, fear, or discomfort that causes physical and emotional symptoms. ?Always see a health care provider to have the reasons for the panic attack correctly diagnosed. ?If your panic attack was caused by a  physical problem, follow your health care provider's suggestions for medicine, referral to a specialist, and lifestyle changes. ?If your panic attack was caused by an emotional problem, follow through with counseling from a qualified mental health specialist. ?If you feel like you may hurt yourself or others, call 911 and get help right away. ?This information is not intended to replace advice given to you by your health care provider. Make sure you discuss any questions you have with your health care provider. ?Document Revised: 02/02/2021 Document Reviewed: 02/02/2021 ?Elsevier Patient Education ? [place]. ? ?

## 2021-10-02 ENCOUNTER — Encounter: Payer: Self-pay | Admitting: Family Medicine

## 2021-10-02 ENCOUNTER — Other Ambulatory Visit: Payer: Self-pay | Admitting: Family Medicine

## 2021-10-02 DIAGNOSIS — F418 Other specified anxiety disorders: Secondary | ICD-10-CM

## 2021-10-02 MED ORDER — ESCITALOPRAM OXALATE 20 MG PO TABS: 20.0000 mg | ORAL_TABLET | Freq: Every day | ORAL | 1 refills | Status: DC

## 2021-10-05 ENCOUNTER — Other Ambulatory Visit (HOSPITAL_COMMUNITY): Payer: Self-pay

## 2021-10-09 ENCOUNTER — Other Ambulatory Visit (HOSPITAL_COMMUNITY): Payer: Self-pay

## 2021-10-10 ENCOUNTER — Other Ambulatory Visit: Payer: Self-pay | Admitting: Pharmacist

## 2021-10-10 ENCOUNTER — Other Ambulatory Visit (HOSPITAL_COMMUNITY): Payer: Self-pay

## 2021-10-10 MED ORDER — DUPIXENT 300 MG/2ML ~~LOC~~ SOAJ
SUBCUTANEOUS | 2 refills | Status: DC
  Filled 2021-10-10: qty 4, 28d supply, fill #0
  Filled 2021-11-09: qty 4, 28d supply, fill #1
  Filled 2021-12-07: qty 4, 28d supply, fill #2

## 2021-10-10 MED ORDER — DUPIXENT 300 MG/2ML ~~LOC~~ SOAJ: SUBCUTANEOUS | 2 refills | Status: DC

## 2021-10-11 ENCOUNTER — Other Ambulatory Visit (HOSPITAL_COMMUNITY): Payer: Self-pay

## 2021-10-16 ENCOUNTER — Other Ambulatory Visit (HOSPITAL_COMMUNITY): Payer: Self-pay

## 2021-10-27 ENCOUNTER — Other Ambulatory Visit (HOSPITAL_COMMUNITY): Payer: Self-pay

## 2021-10-27 MED ORDER — ESCITALOPRAM OXALATE 20 MG PO TABS
20.0000 mg | ORAL_TABLET | Freq: Every day | ORAL | 1 refills | Status: DC
  Filled 2021-10-27: qty 90, 90d supply, fill #0

## 2021-10-27 NOTE — Addendum Note (Signed)
Addended byDamita Dunnings D on: 10/27/2021 08:56 AM ? ? Modules accepted: Orders ? ?

## 2021-11-06 ENCOUNTER — Other Ambulatory Visit (HOSPITAL_COMMUNITY): Payer: Self-pay

## 2021-11-09 ENCOUNTER — Other Ambulatory Visit (HOSPITAL_COMMUNITY): Payer: Self-pay

## 2021-11-14 ENCOUNTER — Other Ambulatory Visit (HOSPITAL_COMMUNITY): Payer: Self-pay

## 2021-12-07 ENCOUNTER — Other Ambulatory Visit (HOSPITAL_COMMUNITY): Payer: Self-pay

## 2021-12-13 ENCOUNTER — Other Ambulatory Visit (HOSPITAL_COMMUNITY): Payer: Self-pay

## 2021-12-14 ENCOUNTER — Other Ambulatory Visit (HOSPITAL_COMMUNITY): Payer: Self-pay

## 2021-12-15 ENCOUNTER — Other Ambulatory Visit (HOSPITAL_COMMUNITY): Payer: Self-pay

## 2022-01-03 ENCOUNTER — Other Ambulatory Visit (HOSPITAL_COMMUNITY): Payer: Self-pay

## 2022-01-04 ENCOUNTER — Other Ambulatory Visit (HOSPITAL_COMMUNITY): Payer: Self-pay

## 2022-01-04 ENCOUNTER — Other Ambulatory Visit: Payer: Self-pay | Admitting: Pharmacist

## 2022-01-04 MED ORDER — DUPIXENT 300 MG/2ML ~~LOC~~ SOAJ: SUBCUTANEOUS | 1 refills | Status: DC

## 2022-01-04 MED ORDER — DUPIXENT 300 MG/2ML ~~LOC~~ SOAJ
SUBCUTANEOUS | 1 refills | Status: DC
  Filled 2022-01-04: qty 4, 30d supply, fill #0
  Filled 2022-02-02: qty 4, 30d supply, fill #1

## 2022-01-10 ENCOUNTER — Other Ambulatory Visit (HOSPITAL_COMMUNITY): Payer: Self-pay

## 2022-02-01 ENCOUNTER — Ambulatory Visit (INDEPENDENT_AMBULATORY_CARE_PROVIDER_SITE_OTHER): Payer: 59 | Admitting: Family Medicine

## 2022-02-01 ENCOUNTER — Other Ambulatory Visit (HOSPITAL_COMMUNITY): Payer: Self-pay

## 2022-02-01 ENCOUNTER — Encounter: Payer: Self-pay | Admitting: Family Medicine

## 2022-02-01 VITALS — BP 118/80 | HR 69 | Temp 98.3°F | Resp 16 | Ht 68.0 in | Wt 157.0 lb

## 2022-02-01 DIAGNOSIS — Z0000 Encounter for general adult medical examination without abnormal findings: Secondary | ICD-10-CM | POA: Diagnosis not present

## 2022-02-01 DIAGNOSIS — F418 Other specified anxiety disorders: Secondary | ICD-10-CM

## 2022-02-01 LAB — COMPREHENSIVE METABOLIC PANEL
ALT: 10 U/L (ref 0–35)
AST: 21 U/L (ref 0–37)
Albumin: 4.3 g/dL (ref 3.5–5.2)
Alkaline Phosphatase: 68 U/L (ref 39–117)
BUN: 14 mg/dL (ref 6–23)
CO2: 31 mEq/L (ref 19–32)
Calcium: 9.4 mg/dL (ref 8.4–10.5)
Chloride: 103 mEq/L (ref 96–112)
Creatinine, Ser: 0.83 mg/dL (ref 0.40–1.20)
GFR: 84.73 mL/min (ref >60.00)
Glucose, Bld: 86 mg/dL (ref 70–99)
Potassium: 5 mEq/L (ref 3.5–5.1)
Sodium: 139 mEq/L (ref 135–145)
Total Bilirubin: 1 mg/dL (ref 0.2–1.2)
Total Protein: 7.3 g/dL (ref 6.0–8.3)

## 2022-02-01 LAB — TSH: TSH: 1.61 u[IU]/mL (ref 0.35–5.50)

## 2022-02-01 LAB — CBC WITH DIFFERENTIAL/PLATELET
Basophils Absolute: 0 10*3/uL (ref 0.0–0.1)
Basophils Relative: 0.5 % (ref 0.0–3.0)
Eosinophils Absolute: 0.1 10*3/uL (ref 0.0–0.7)
Eosinophils Relative: 1.2 % (ref 0.0–5.0)
HCT: 42 % (ref 36.0–46.0)
Hemoglobin: 13.8 g/dL (ref 12.0–15.0)
Lymphocytes Relative: 31.4 % (ref 12.0–46.0)
Lymphs Abs: 1.6 10*3/uL (ref 0.7–4.0)
MCHC: 32.9 g/dL (ref 30.0–36.0)
MCV: 90.4 fl (ref 78.0–100.0)
Monocytes Absolute: 0.4 10*3/uL (ref 0.1–1.0)
Monocytes Relative: 8.1 % (ref 3.0–12.0)
Neutro Abs: 3 10*3/uL (ref 1.4–7.7)
Neutrophils Relative %: 58.8 % (ref 43.0–77.0)
Platelets: 191 10*3/uL (ref 150.0–400.0)
RBC: 4.65 Mil/uL (ref 3.87–5.11)
RDW: 12.7 % (ref 11.5–15.5)
WBC: 5.1 10*3/uL (ref 4.0–10.5)

## 2022-02-01 LAB — LIPID PANEL
Cholesterol: 228 mg/dL — ABNORMAL HIGH (ref 0–200)
HDL: 47.8 mg/dL (ref >39.00)
LDL Cholesterol: 166 mg/dL — ABNORMAL HIGH (ref 0–99)
NonHDL: 179.81
Total CHOL/HDL Ratio: 5
Triglycerides: 67 mg/dL (ref 0.0–149.0)
VLDL: 13.4 mg/dL (ref 0.0–40.0)

## 2022-02-01 MED ORDER — ESCITALOPRAM OXALATE 10 MG PO TABS
10.0000 mg | ORAL_TABLET | Freq: Every day | ORAL | 3 refills | Status: DC
  Filled 2022-02-01: qty 90, 90d supply, fill #0
  Filled 2022-06-26: qty 30, 30d supply, fill #1

## 2022-02-01 MED ORDER — ESCITALOPRAM OXALATE 20 MG PO TABS
20.0000 mg | ORAL_TABLET | Freq: Every day | ORAL | 3 refills | Status: DC
Start: 2022-02-01 — End: 2022-02-01
  Filled 2022-02-01: qty 90, 90d supply, fill #0

## 2022-02-01 NOTE — Patient Instructions (Signed)
Preventive Care 40-46 Years Old, Female Preventive care refers to lifestyle choices and visits with your health care provider that can promote health and wellness. Preventive care visits are also called wellness exams. What can I expect for my preventive care visit? Counseling Your health care provider may ask you questions about your: Medical history, including: Past medical problems. Family medical history. Pregnancy history. Current health, including: Menstrual cycle. Method of birth control. Emotional well-being. Home life and relationship well-being. Sexual activity and sexual health. Lifestyle, including: Alcohol, nicotine or tobacco, and drug use. Access to firearms. Diet, exercise, and sleep habits. Work and work environment. Sunscreen use. Safety issues such as seatbelt and bike helmet use. Physical exam Your health care provider will check your: Height and weight. These may be used to calculate your BMI (body mass index). BMI is a measurement that tells if you are at a healthy weight. Waist circumference. This measures the distance around your waistline. This measurement also tells if you are at a healthy weight and may help predict your risk of certain diseases, such as type 2 diabetes and high blood pressure. Heart rate and blood pressure. Body temperature. Skin for abnormal spots. What immunizations do I need?  Vaccines are usually given at various ages, according to a schedule. Your health care provider will recommend vaccines for you based on your age, medical history, and lifestyle or other factors, such as travel or where you work. What tests do I need? Screening Your health care provider may recommend screening tests for certain conditions. This may include: Lipid and cholesterol levels. Diabetes screening. This is done by checking your blood sugar (glucose) after you have not eaten for a while (fasting). Pelvic exam and Pap test. Hepatitis B test. Hepatitis C  test. HIV (human immunodeficiency virus) test. STI (sexually transmitted infection) testing, if you are at risk. Lung cancer screening. Colorectal cancer screening. Mammogram. Talk with your health care provider about when you should start having regular mammograms. This may depend on whether you have a family history of breast cancer. BRCA-related cancer screening. This may be done if you have a family history of breast, ovarian, tubal, or peritoneal cancers. Bone density scan. This is done to screen for osteoporosis. Talk with your health care provider about your test results, treatment options, and if necessary, the need for more tests. Follow these instructions at home: Eating and drinking  Eat a diet that includes fresh fruits and vegetables, whole grains, lean protein, and low-fat dairy products. Take vitamin and mineral supplements as recommended by your health care provider. Do not drink alcohol if: Your health care provider tells you not to drink. You are pregnant, may be pregnant, or are planning to become pregnant. If you drink alcohol: Limit how much you have to 0-1 drink a day. Know how much alcohol is in your drink. In the [place] one drink equals one 12 oz bottle of beer (355 mL), one 5 oz glass of wine (148 mL), or one 1 oz glass of hard liquor (44 mL). Lifestyle Brush your teeth every morning and night with fluoride toothpaste. Floss one time each day. Exercise for at least 30 minutes 5 or more days each week. Do not use any products that contain nicotine or tobacco. These products include cigarettes, chewing tobacco, and vaping devices, such as e-cigarettes. If you need help quitting, ask your health care provider. Do not use drugs. If you are sexually active, practice safe sex. Use a condom or other form of protection to   prevent STIs. If you do not wish to become pregnant, use a form of birth control. If you plan to become pregnant, see your health care provider for a  prepregnancy visit. Take aspirin only as told by your health care provider. Make sure that you understand how much to take and what form to take. Work with your health care provider to find out whether it is safe and beneficial for you to take aspirin daily. Find healthy ways to manage stress, such as: Meditation, yoga, or listening to music. Journaling. Talking to a trusted person. Spending time with friends and family. Minimize exposure to UV radiation to reduce your risk of skin cancer. Safety Always wear your seat belt while driving or riding in a vehicle. Do not drive: If you have been drinking alcohol. Do not ride with someone who has been drinking. When you are tired or distracted. While texting. If you have been using any mind-altering substances or drugs. Wear a helmet and other protective equipment during sports activities. If you have firearms in your house, make sure you follow all gun safety procedures. Seek help if you have been physically or sexually abused. What's next? Visit your health care provider once a year for an annual wellness visit. Ask your health care provider how often you should have your eyes and teeth checked. Stay up to date on all vaccines. This information is not intended to replace advice given to you by your health care provider. Make sure you discuss any questions you have with your health care provider. Document Revised: 12/21/2020 Document Reviewed: 12/21/2020 Elsevier Patient Education  [place].

## 2022-02-01 NOTE — Progress Notes (Signed)
Subjective:   By signing my name below, I, Luiz Ochoa, attest that this documentation has been prepared under the direction and in the presence of Ann Held, DO  02/01/2022   Patient ID: Lydia Davis, female    DOB: [birth year], 46 y.o.   MRN: [id number]  Chief Complaint  Patient presents with   Annual Exam    Pt states fasting    HPI Patient is in today for comprehensive physical.  She wants to stop taking the Lexipro, but when she stopped taking it she developed chest pain and her anxiety symptoms reoccurred, so she continues to take it at this time. She adds that when she takes Lexipro she becomes tired and fatigued, and requests to have a lower dose.  She has not been taking the Xanex, but she does exercise more by doing cardio and going to the gym to lift weights. She remains compliant with Dupixent and takes it every two weeks.   She denies having any fever, new moles, congestion, sinus pain, sore throat, palpitations, cough, shortness of breath, wheezing, nausea, vomiting, diarrhea, constipation, dysuria, frequency, abdominal pain, hematuria, new muscle pain, new joint pain, headaches.  She regularly sees her gynecologist. She has taken the two Covid-19 vaccines but has not had the booster at this time. She no longer menstruates due to having an IUD.  No changes in family history and surgical operations.   Past Medical History:  Diagnosis Date   Anxiety    Eczema    History of TB skin testing    postive    Past Surgical History:  Procedure Laterality Date   CESAREAN SECTION  2012   TONSILLECTOMY  1994    Family History  Problem Relation Age of Onset   Hypertension Father        and both sets of grandparents   Psoriasis Father    Diabetes Maternal Grandfather    Hyperlipidemia Paternal Grandfather    Heart attack Paternal Grandfather    Heart disease Other        both sets of grandparents    Social History   Socioeconomic History    Marital status: Married    Spouse name: Not on file   Number of children: 1   Years of education: Not on file   Highest education level: Not on file  Occupational History   Occupation: Registered Nurse    Employer: [employer]    Comment: works in the OR  Tobacco Use   Smoking status: Never   Smokeless tobacco: Never  Substance and Sexual Activity   Alcohol use: Yes    Alcohol/week: 0.0 standard drinks of alcohol    Comment: occasionally---glass of wine   Drug use: No   Sexual activity: Yes    Partners: Male    Birth control/protection: I.U.D.  Other Topics Concern   Not on file  Social History Narrative   Exercise--Walking   Social Determinants of Health   Financial Resource Strain: Not on file  Food Insecurity: Not on file  Transportation Needs: Not on file  Physical Activity: Not on file  Stress: Not on file  Social Connections: Not on file  Intimate Partner Violence: Not on file    Outpatient Medications Prior to Visit  Medication Sig Dispense Refill   ALPRAZolam (XANAX) 0.25 MG tablet Take 1 tablet (0.25 mg total) by mouth 2 (two) times daily as needed for anxiety. 20 tablet 0   cetirizine (ZYRTEC) 10 MG tablet Take 10 mg by  mouth daily.     Dupilumab (DUPIXENT) 300 MG/2ML SOPN INJECT 1 PEN ('300MG'$ ) UNDER THE SKIN EVERY TWO WEEKS STARTING ON DAY 15 AS DIRECTED. 4 mL 1   levonorgestrel (MIRENA) 20 MCG/24HR IUD 1 each by Intrauterine route once.     escitalopram (LEXAPRO) 20 MG tablet Take 1 tablet (20 mg total) by mouth daily. 90 tablet 1   No facility-administered medications prior to visit.    Allergies  Allergen Reactions   Augmentin [Amoxicillin-Pot Clavulanate] Rash    Review of Systems  Constitutional:  Positive for malaise/fatigue. Negative for fever.  HENT:  Negative for congestion, sinus pain and sore throat.   Respiratory:  Negative for cough, shortness of breath and wheezing.   Cardiovascular:  Positive for chest pain. Negative for palpitations.   Gastrointestinal:  Negative for abdominal pain, constipation, diarrhea, nausea and vomiting.  Genitourinary:  Negative for dysuria, frequency and hematuria.  Musculoskeletal:  Negative for joint pain and myalgias.  Skin:        (-) New Moles  Neurological:  Negative for headaches.  Psychiatric/Behavioral:  The patient is nervous/anxious.        Objective:    Physical Exam Constitutional:      Appearance: Normal appearance. She is not ill-appearing.  HENT:     Head: Normocephalic and atraumatic.     Right Ear: External ear normal.     Left Ear: External ear normal.  Eyes:     Extraocular Movements: Extraocular movements intact.     Pupils: Pupils are equal, round, and reactive to light.  Cardiovascular:     Rate and Rhythm: Normal rate and regular rhythm.     Pulses: Normal pulses.     Heart sounds: Normal heart sounds. No murmur heard.    No gallop.  Pulmonary:     Effort: Pulmonary effort is normal. No respiratory distress.     Breath sounds: Normal breath sounds. No wheezing or rales.  Abdominal:     General: Bowel sounds are normal. There is no distension.     Palpations: Abdomen is soft.     Tenderness: There is no abdominal tenderness. There is no guarding.  Neurological:     Mental Status: She is alert and oriented to person, place, and time.  Psychiatric:        Judgment: Judgment normal.     BP 118/80 (BP Location: Right Arm, Patient Position: Sitting, Cuff Size: Normal)   Pulse 69   Temp 98.3 F (36.8 C) (Oral)   Resp 16   Ht '5\' 8"'$  (1.727 m)   Wt 157 lb (71.2 kg)   SpO2 97%   BMI 23.87 kg/m  Wt Readings from Last 3 Encounters:  02/01/22 157 lb (71.2 kg)  09/19/21 158 lb (71.7 kg)  01/30/21 158 lb 12.8 oz (72 kg)    Diabetic Foot Exam - Simple   No data filed    Lab Results  Component Value Date   WBC 3.8 (L) 01/30/2021   HGB 13.3 01/30/2021   HCT 40.1 01/30/2021   PLT 191.0 01/30/2021   GLUCOSE 86 01/30/2021   CHOL 199 01/30/2021   TRIG  70.0 01/30/2021   HDL 51.00 01/30/2021   LDLCALC 134 (H) 01/30/2021   ALT 8 01/30/2021   AST 16 01/30/2021   NA 138 01/30/2021   K 4.6 01/30/2021   CL 104 01/30/2021   CREATININE 0.74 01/30/2021   BUN 15 01/30/2021   CO2 30 01/30/2021   TSH 1.75 01/30/2021  Lab Results  Component Value Date   TSH 1.75 01/30/2021   Lab Results  Component Value Date   WBC 3.8 (L) 01/30/2021   HGB 13.3 01/30/2021   HCT 40.1 01/30/2021   MCV 89.9 01/30/2021   PLT 191.0 01/30/2021   Lab Results  Component Value Date   NA 138 01/30/2021   K 4.6 01/30/2021   CO2 30 01/30/2021   GLUCOSE 86 01/30/2021   BUN 15 01/30/2021   CREATININE 0.74 01/30/2021   BILITOT 0.6 01/30/2021   ALKPHOS 54 01/30/2021   AST 16 01/30/2021   ALT 8 01/30/2021   PROT 6.8 01/30/2021   ALBUMIN 4.1 01/30/2021   CALCIUM 9.3 01/30/2021   ANIONGAP 10 08/02/2018   GFR 97.94 01/30/2021   Lab Results  Component Value Date   CHOL 199 01/30/2021   Lab Results  Component Value Date   HDL 51.00 01/30/2021   Lab Results  Component Value Date   LDLCALC 134 (H) 01/30/2021   Lab Results  Component Value Date   TRIG 70.0 01/30/2021   Lab Results  Component Value Date   CHOLHDL 4 01/30/2021   No results found for: "HGBA1C"      Pap Smear- Last competed 07/09/2020.    Assessment & Plan:   Problem List Items Addressed This Visit       Unprioritized   Preventative health care - Primary    ghm utd Check labs  See avs       Relevant Orders   Comprehensive metabolic panel   CBC with Differential/Platelet   Lipid panel   TSH   Other Visit Diagnoses     Depression with anxiety       Relevant Medications   escitalopram (LEXAPRO) 10 MG tablet        Meds ordered this encounter  Medications   DISCONTD: escitalopram (LEXAPRO) 20 MG tablet    Sig: Take 1 tablet (20 mg total) by mouth daily.    Dispense:  90 tablet    Refill:  3   escitalopram (LEXAPRO) 10 MG tablet    Sig: Take 1 tablet  by  mouth daily.    Dispense:  90 tablet    Refill:  3    Please cancel 20 mg lexapro    I, Ann Held, DO, personally preformed the services described in this documentation.  All medical record entries made by the scribe were at my direction and in my presence.  I have reviewed the chart and discharge instructions (if applicable) and agree that the record reflects my personal performance and is accurate and complete. 02/01/2022   I,Tinashe Williams,acting as a scribe for Ann Held, DO.,have documented all relevant documentation on the behalf of Ann Held, DO,as directed by  Ann Held, DO while in the presence of Ann Held, DO.    Ann Held, DO

## 2022-02-01 NOTE — Assessment & Plan Note (Signed)
ghm utd Check labs  See avs  

## 2022-02-02 ENCOUNTER — Other Ambulatory Visit (HOSPITAL_COMMUNITY): Payer: Self-pay

## 2022-02-05 ENCOUNTER — Other Ambulatory Visit (HOSPITAL_COMMUNITY): Payer: Self-pay

## 2022-02-07 ENCOUNTER — Other Ambulatory Visit (HOSPITAL_COMMUNITY): Payer: Self-pay

## 2022-02-13 ENCOUNTER — Other Ambulatory Visit: Payer: Self-pay | Admitting: Family Medicine

## 2022-02-13 DIAGNOSIS — E785 Hyperlipidemia, unspecified: Secondary | ICD-10-CM

## 2022-03-01 ENCOUNTER — Other Ambulatory Visit (HOSPITAL_COMMUNITY): Payer: Self-pay

## 2022-03-01 ENCOUNTER — Other Ambulatory Visit: Payer: Self-pay | Admitting: Pharmacist

## 2022-03-01 MED ORDER — DUPIXENT 300 MG/2ML ~~LOC~~ SOAJ
SUBCUTANEOUS | 0 refills | Status: DC
Start: 2022-03-01 — End: 2022-03-01

## 2022-03-01 MED ORDER — DUPIXENT 300 MG/2ML ~~LOC~~ SOAJ
SUBCUTANEOUS | 0 refills | Status: DC
Start: 2022-03-01 — End: 2022-03-29
  Filled 2022-03-01: qty 4, 28d supply, fill #0

## 2022-03-06 ENCOUNTER — Other Ambulatory Visit (HOSPITAL_COMMUNITY): Payer: Self-pay

## 2022-03-27 ENCOUNTER — Encounter: Payer: Self-pay | Admitting: Family Medicine

## 2022-03-27 DIAGNOSIS — Z1211 Encounter for screening for malignant neoplasm of colon: Secondary | ICD-10-CM

## 2022-03-28 NOTE — Telephone Encounter (Signed)
Okay to place cologuard orders?

## 2022-03-29 ENCOUNTER — Other Ambulatory Visit (HOSPITAL_COMMUNITY): Payer: Self-pay

## 2022-03-29 ENCOUNTER — Other Ambulatory Visit: Payer: Self-pay | Admitting: Pharmacist

## 2022-03-29 MED ORDER — DUPIXENT 300 MG/2ML ~~LOC~~ SOAJ
SUBCUTANEOUS | 0 refills | Status: DC
  Filled 2022-03-29: qty 4, 28d supply, fill #0

## 2022-03-29 MED ORDER — DUPIXENT 300 MG/2ML ~~LOC~~ SOAJ: SUBCUTANEOUS | 0 refills | Status: DC

## 2022-03-30 ENCOUNTER — Encounter: Payer: 59 | Admitting: Gastroenterology

## 2022-04-04 ENCOUNTER — Other Ambulatory Visit (HOSPITAL_COMMUNITY): Payer: Self-pay

## 2022-04-10 DIAGNOSIS — Z1211 Encounter for screening for malignant neoplasm of colon: Secondary | ICD-10-CM | POA: Diagnosis not present

## 2022-04-16 LAB — COLOGUARD: COLOGUARD: NEGATIVE

## 2022-04-26 ENCOUNTER — Other Ambulatory Visit (HOSPITAL_COMMUNITY): Payer: Self-pay

## 2022-04-27 ENCOUNTER — Other Ambulatory Visit (HOSPITAL_COMMUNITY): Payer: Self-pay

## 2022-04-27 ENCOUNTER — Other Ambulatory Visit: Payer: Self-pay | Admitting: Internal Medicine

## 2022-04-30 ENCOUNTER — Other Ambulatory Visit (HOSPITAL_COMMUNITY): Payer: Self-pay

## 2022-04-30 ENCOUNTER — Encounter: Payer: Self-pay | Admitting: Family Medicine

## 2022-05-01 ENCOUNTER — Other Ambulatory Visit (HOSPITAL_COMMUNITY): Payer: Self-pay

## 2022-05-04 ENCOUNTER — Other Ambulatory Visit (HOSPITAL_COMMUNITY): Payer: Self-pay

## 2022-05-29 DIAGNOSIS — L814 Other melanin hyperpigmentation: Secondary | ICD-10-CM | POA: Diagnosis not present

## 2022-05-29 DIAGNOSIS — D485 Neoplasm of uncertain behavior of skin: Secondary | ICD-10-CM | POA: Diagnosis not present

## 2022-05-29 DIAGNOSIS — D2239 Melanocytic nevi of other parts of face: Secondary | ICD-10-CM | POA: Diagnosis not present

## 2022-05-29 DIAGNOSIS — L578 Other skin changes due to chronic exposure to nonionizing radiation: Secondary | ICD-10-CM | POA: Diagnosis not present

## 2022-05-29 DIAGNOSIS — D225 Melanocytic nevi of trunk: Secondary | ICD-10-CM | POA: Diagnosis not present

## 2022-06-26 ENCOUNTER — Other Ambulatory Visit: Payer: Self-pay

## 2022-06-26 ENCOUNTER — Other Ambulatory Visit (HOSPITAL_COMMUNITY): Payer: Self-pay

## 2022-07-04 ENCOUNTER — Other Ambulatory Visit (HOSPITAL_COMMUNITY): Payer: Self-pay

## 2022-07-16 ENCOUNTER — Other Ambulatory Visit: Payer: Self-pay | Admitting: Internal Medicine

## 2022-07-17 ENCOUNTER — Encounter: Payer: Self-pay | Admitting: Family Medicine

## 2022-07-17 ENCOUNTER — Other Ambulatory Visit: Payer: Self-pay | Admitting: Family Medicine

## 2022-07-17 ENCOUNTER — Other Ambulatory Visit (HOSPITAL_COMMUNITY): Payer: Self-pay

## 2022-07-17 DIAGNOSIS — L2084 Intrinsic (allergic) eczema: Secondary | ICD-10-CM | POA: Diagnosis not present

## 2022-07-17 NOTE — Telephone Encounter (Signed)
Please send to correct provider- Dr. Carollee Herter  Requested Prescriptions  Refused Prescriptions Disp Refills   Dupilumab (DUPIXENT) 300 MG/2ML SOPN 4 mL 0    Sig: INJECT 1 PEN ('300MG'$ ) UNDER THE SKIN EVERY TWO WEEKS STARTING ON DAY 15 AS DIRECTED.     Off-Protocol Failed - 07/16/2022  7:14 PM      Failed - Medication not assigned to a protocol, review manually.      Failed - Valid encounter within last 12 months    Recent Outpatient Visits           1 year ago Encounter for medication review   [place], RPH[facility]   2 years ago Encounter for medication review   [place], RPH[facility]              '

## 2022-07-26 ENCOUNTER — Ambulatory Visit: Payer: 59 | Attending: Family Medicine | Admitting: Pharmacist

## 2022-07-26 ENCOUNTER — Other Ambulatory Visit (HOSPITAL_COMMUNITY): Payer: Self-pay

## 2022-07-26 DIAGNOSIS — Z79899 Other long term (current) drug therapy: Secondary | ICD-10-CM

## 2022-07-26 MED ORDER — DUPILUMAB 300 MG/2ML ~~LOC~~ SOAJ: SUBCUTANEOUS | 3 refills | Status: DC

## 2022-07-26 MED ORDER — DUPILUMAB 300 MG/2ML ~~LOC~~ SOAJ
SUBCUTANEOUS | 3 refills | Status: DC
  Filled 2022-07-26: qty 4, fill #0
  Filled 2022-08-20: qty 4, 28d supply, fill #0
  Filled 2022-09-17: qty 4, 28d supply, fill #1
  Filled 2022-10-16: qty 4, 28d supply, fill #2
  Filled 2022-11-13: qty 4, 28d supply, fill #3

## 2022-07-26 NOTE — Progress Notes (Signed)
   S: Patient presents for review of their specialty medication therapy.   Patient is currently taking Dupixent for atopic dermatitis. Patient is managed by Dr. Renda Rolls for this.   Adherence: insurance recently changed and there has been a lapse in therapy. However, medication is now approved.  Efficacy: reports that it works well for her when she is able to take it consistently   Dosing: 600 mg x1 followed by '300mg'$  q14days  Dose adjustments: Renal: no dose adjustments (has not been studied) Hepatic: no dose adjustments (has not been studied)  Drug-drug interactions: none identified  Monitoring: S/sx of infection: none S/sx of hypersensitivity: none S/sx of ocular effects: none  S/sx of eosinophilia/vasculitis: none  O:     Lab Results  Component Value Date   WBC 5.1 02/01/2022   HGB 13.8 02/01/2022   HCT 42.0 02/01/2022   MCV 90.4 02/01/2022   PLT 191.0 02/01/2022      Chemistry      Component Value Date/Time   NA 139 02/01/2022 1035   K 5.0 02/01/2022 1035   CL 103 02/01/2022 1035   CO2 31 02/01/2022 1035   BUN 14 02/01/2022 1035   CREATININE 0.83 02/01/2022 1035      Component Value Date/Time   CALCIUM 9.4 02/01/2022 1035   ALKPHOS 68 02/01/2022 1035   AST 21 02/01/2022 1035   ALT 10 02/01/2022 1035   BILITOT 1.0 02/01/2022 1035       A/P: 1. Medication review: Patient currently on [place] for atopic dermatitis. Reviewed the medication with the patient, including the following: Dupixent is a monoclonal antibody used for the treatment of asthma or atopic dermatitis. Patient educated on purpose, proper use and potential adverse effects of Dupixent. Possible adverse effects include increased risk of infection, ocular effects, vasculitis/eosinophilia, and hypersensitivity reactions. Administer as a SubQ injection and rotate sites. Allow the medication to reach room temp prior to administration (45 mins for 300 mg syringe or 30 min for 200 mg syringe). Do  not shake. Discard any unused portion. No recommendations for any changes.   Benard Halsted, PharmD, [facility], [place] [phone]

## 2022-07-31 ENCOUNTER — Other Ambulatory Visit (HOSPITAL_COMMUNITY): Payer: Self-pay

## 2022-08-01 ENCOUNTER — Other Ambulatory Visit (HOSPITAL_COMMUNITY): Payer: Self-pay

## 2022-08-20 ENCOUNTER — Other Ambulatory Visit: Payer: Self-pay

## 2022-08-20 ENCOUNTER — Other Ambulatory Visit (HOSPITAL_COMMUNITY): Payer: Self-pay

## 2022-08-21 ENCOUNTER — Other Ambulatory Visit: Payer: Self-pay

## 2022-09-06 ENCOUNTER — Ambulatory Visit
Admission: EM | Admit: 2022-09-06 | Discharge: 2022-09-06 | Disposition: A | Discharge status: Home / Self Care | Payer: 59

## 2022-09-06 DIAGNOSIS — H01001 Unspecified blepharitis right upper eyelid: Secondary | ICD-10-CM | POA: Diagnosis not present

## 2022-09-06 NOTE — ED Triage Notes (Signed)
Pt c/o RT upper eyelid pain, redness and swelling x 3 days. Denies any drainage.

## 2022-09-06 NOTE — ED Provider Notes (Signed)
[facility]    CSN: [id number] Arrival date & time: 09/06/22  0851      History   Chief Complaint Chief Complaint  Patient presents with   Eye Problem    RT    HPI Lydia Davis is a 47 y.o. female.  3 day history of right eyelid redness, swelling No pain but a little itching  Denies eye redness, crusting, drainage No fevers Denies vision changes  She tried a few warm and cold compress  Past Medical History:  Diagnosis Date   Anxiety    Eczema    History of TB skin testing    postive    Patient Active Problem List   Diagnosis Date Noted   Blurry vision, bilateral 01/30/2021   Panic 07/31/2018   Anxiety 07/31/2018   Pulmonary nodule 07/04/2018   AD (atopic dermatitis) 02/09/2015   Preventative health care 06/17/2014    Past Surgical History:  Procedure Laterality Date   CESAREAN SECTION  2012   TONSILLECTOMY  1994    OB History     Gravida  1   Para  1   Term  1   Preterm  0   AB  0   Living  1      SAB  0   IAB  0   Ectopic  0   Multiple  0   Live Births  1            Home Medications    Prior to Admission medications   Medication Sig Start Date End Date Taking? Authorizing Provider  ALPRAZolam (XANAX) 0.25 MG tablet Take 1 tablet (0.25 mg total) by mouth 2 (two) times daily as needed for anxiety. 09/19/21   Ann Held, DO  cetirizine (ZYRTEC) 10 MG tablet Take 10 mg by mouth daily.    [provider]  Dupilumab 300 MG/2ML SOPN 1 injection subcutaneously. Start day 15 then every other week. 07/26/22   Tresa Garter, MD  levonorgestrel (MIRENA) 20 MCG/24HR IUD 1 each by Intrauterine route once.    [provider]    Family History Family History  Problem Relation Age of Onset   Hypertension Father        and both sets of grandparents   Psoriasis Father    Diabetes Maternal Grandfather    Hyperlipidemia Paternal Grandfather    Heart attack Paternal Grandfather    Heart  disease Other        both sets of grandparents    Social History Social History   Tobacco Use   Smoking status: Never   Smokeless tobacco: Never  Substance Use Topics   Alcohol use: Yes    Alcohol/week: 0.0 standard drinks of alcohol    Comment: occasionally---glass of wine   Drug use: No     Allergies   Augmentin [amoxicillin-pot clavulanate]   Review of Systems Review of Systems As per HPI  Physical Exam Triage Vital Signs ED Triage Vitals  Enc Vitals Group     BP 09/06/22 0857 117/79     Pulse Rate 09/06/22 0857 74     Resp 09/06/22 0857 17     Temp 09/06/22 0857 98 F (36.7 C)     Temp Source 09/06/22 0857 Oral     SpO2 09/06/22 0857 97 %     Weight --      Height --      Head Circumference --      Peak Flow --  Pain Score 09/06/22 0858 1     Pain Loc --      Pain Edu? --      Excl. in [place]? --    No data found.  Updated Vital Signs BP 117/79 (BP Location: Right Arm)   Pulse 74   Temp 98 F (36.7 C) (Oral)   Resp 17   SpO2 97%   Physical Exam Vitals and nursing note reviewed.  Constitutional:      General: She is not in acute distress. Eyes:     General: Lids are everted, no foreign bodies appreciated. Vision grossly intact. Gaze aligned appropriately.        Right eye: No foreign body or hordeolum.     Extraocular Movements: Extraocular movements intact.     Conjunctiva/sclera: Conjunctivae normal.     Right eye: Right conjunctiva is not injected.     Pupils: Pupils are equal, round, and reactive to light.     Comments: Right upper eyelid mildly swollen, erythematous. No crusting or scaling. Eye is not injected. No drainage  Cardiovascular:     Rate and Rhythm: Normal rate and regular rhythm.  Pulmonary:     Effort: Pulmonary effort is normal.  Neurological:     Mental Status: She is alert and oriented to person, place, and time.     UC Treatments / Results  Labs (all labs ordered are listed, but only abnormal results are  displayed) Labs Reviewed - No data to display  EKG  Radiology No results found.  Procedures Procedures (including critical care time)  Medications Ordered in UC Medications - No data to display  Initial Impression / Assessment and Plan / UC Course  I have reviewed the triage vital signs and the nursing notes.  Pertinent labs & imaging results that were available during my care of the patient were reviewed by me and considered in my medical decision making (see chart for details).  Blepharitis right eyelid No concern for bacterial infection Discussed warm compress, gentle massage Return precautions discussed. Patient agrees to plan  Final Clinical Impressions(s) / UC Diagnoses   Final diagnoses:  Blepharitis of right upper eyelid, unspecified type     Discharge Instructions      I recommend warm compress to the eyelid, 15 minutes at a time, several times daily Gentle massage of the eyelid Symptoms should clear in several days  Please return with any concerns      ED Prescriptions   None    PDMP not reviewed this encounter.   Les Pou, [place] 09/06/22 [phone]

## 2022-09-06 NOTE — Discharge Instructions (Signed)
I recommend warm compress to the eyelid, 15 minutes at a time, several times daily Gentle massage of the eyelid Symptoms should clear in several days  Please return with any concerns

## 2022-09-10 DIAGNOSIS — Z6824 Body mass index (BMI) 24.0-24.9, adult: Secondary | ICD-10-CM | POA: Diagnosis not present

## 2022-09-10 DIAGNOSIS — Z01419 Encounter for gynecological examination (general) (routine) without abnormal findings: Secondary | ICD-10-CM | POA: Diagnosis not present

## 2022-09-10 DIAGNOSIS — G4700 Insomnia, unspecified: Secondary | ICD-10-CM | POA: Diagnosis not present

## 2022-09-10 DIAGNOSIS — Z1231 Encounter for screening mammogram for malignant neoplasm of breast: Secondary | ICD-10-CM | POA: Diagnosis not present

## 2022-09-17 ENCOUNTER — Other Ambulatory Visit: Payer: Self-pay

## 2022-09-19 ENCOUNTER — Other Ambulatory Visit (HOSPITAL_COMMUNITY): Payer: Self-pay

## 2022-10-16 ENCOUNTER — Other Ambulatory Visit (HOSPITAL_COMMUNITY): Payer: Self-pay

## 2022-11-13 ENCOUNTER — Other Ambulatory Visit (HOSPITAL_COMMUNITY): Payer: Self-pay

## 2022-11-19 ENCOUNTER — Other Ambulatory Visit (HOSPITAL_COMMUNITY): Payer: Self-pay

## 2022-11-27 ENCOUNTER — Other Ambulatory Visit: Payer: Self-pay

## 2022-12-12 ENCOUNTER — Other Ambulatory Visit (HOSPITAL_COMMUNITY): Payer: Self-pay

## 2022-12-12 MED ORDER — DUPIXENT 300 MG/2ML ~~LOC~~ SOAJ: SUBCUTANEOUS | 1 refills | Status: DC

## 2022-12-13 ENCOUNTER — Other Ambulatory Visit (HOSPITAL_COMMUNITY): Payer: Self-pay

## 2022-12-13 ENCOUNTER — Other Ambulatory Visit: Payer: Self-pay | Admitting: Pharmacist

## 2022-12-13 ENCOUNTER — Other Ambulatory Visit: Payer: Self-pay

## 2022-12-13 MED ORDER — DUPIXENT 300 MG/2ML ~~LOC~~ SOAJ
SUBCUTANEOUS | 1 refills | Status: DC
  Filled 2022-12-13: qty 4, 28d supply, fill #0
  Filled 2023-01-11: qty 4, 28d supply, fill #1

## 2022-12-17 ENCOUNTER — Other Ambulatory Visit (HOSPITAL_COMMUNITY): Payer: Self-pay

## 2023-01-08 ENCOUNTER — Other Ambulatory Visit (HOSPITAL_COMMUNITY): Payer: Self-pay

## 2023-01-11 ENCOUNTER — Other Ambulatory Visit (HOSPITAL_COMMUNITY): Payer: Self-pay

## 2023-01-14 ENCOUNTER — Other Ambulatory Visit (HOSPITAL_COMMUNITY): Payer: Self-pay

## 2023-01-15 DIAGNOSIS — L2084 Intrinsic (allergic) eczema: Secondary | ICD-10-CM | POA: Diagnosis not present

## 2023-02-04 ENCOUNTER — Other Ambulatory Visit (HOSPITAL_COMMUNITY): Payer: Self-pay

## 2023-02-04 ENCOUNTER — Other Ambulatory Visit: Payer: Self-pay

## 2023-02-04 ENCOUNTER — Other Ambulatory Visit: Payer: Self-pay | Admitting: Pharmacist

## 2023-02-04 MED ORDER — DUPIXENT 300 MG/2ML ~~LOC~~ SOAJ: SUBCUTANEOUS | 10 refills | Status: DC

## 2023-02-04 MED ORDER — DUPIXENT 300 MG/2ML ~~LOC~~ SOAJ
SUBCUTANEOUS | 10 refills | Status: DC
  Filled 2023-02-04: qty 4, 28d supply, fill #0
  Filled 2023-03-04: qty 4, 28d supply, fill #1
  Filled 2023-04-02: qty 4, 28d supply, fill #2
  Filled 2023-05-02: qty 4, 28d supply, fill #3
  Filled 2023-05-27: qty 4, 28d supply, fill #4
  Filled 2023-06-28: qty 4, 28d supply, fill #5
  Filled 2023-07-29: qty 4, 28d supply, fill #6
  Filled 2023-08-29: qty 4, 28d supply, fill #7
  Filled 2023-09-26 (×2): qty 4, 28d supply, fill #8
  Filled 2023-10-28: qty 4, 28d supply, fill #9
  Filled 2023-12-03: qty 4, 28d supply, fill #10

## 2023-02-05 ENCOUNTER — Encounter: Payer: Self-pay | Admitting: Family Medicine

## 2023-02-05 ENCOUNTER — Ambulatory Visit (INDEPENDENT_AMBULATORY_CARE_PROVIDER_SITE_OTHER): Payer: 59 | Admitting: Family Medicine

## 2023-02-05 ENCOUNTER — Other Ambulatory Visit: Payer: Self-pay

## 2023-02-05 VITALS — BP 110/70 | HR 71 | Temp 98.7°F | Resp 18 | Ht 68.0 in | Wt 154.6 lb

## 2023-02-05 DIAGNOSIS — Z0000 Encounter for general adult medical examination without abnormal findings: Secondary | ICD-10-CM

## 2023-02-05 DIAGNOSIS — E785 Hyperlipidemia, unspecified: Secondary | ICD-10-CM | POA: Diagnosis not present

## 2023-02-05 DIAGNOSIS — L209 Atopic dermatitis, unspecified: Secondary | ICD-10-CM

## 2023-02-05 DIAGNOSIS — F410 Panic disorder [episodic paroxysmal anxiety] without agoraphobia: Secondary | ICD-10-CM

## 2023-02-05 DIAGNOSIS — F419 Anxiety disorder, unspecified: Secondary | ICD-10-CM | POA: Diagnosis not present

## 2023-02-05 LAB — CBC WITH DIFFERENTIAL/PLATELET
Basophils Absolute: 0 10*3/uL (ref 0.0–0.1)
Basophils Relative: 0.6 % (ref 0.0–3.0)
Eosinophils Absolute: 0.1 10*3/uL (ref 0.0–0.7)
Eosinophils Relative: 1.4 % (ref 0.0–5.0)
HCT: 42.9 % (ref 36.0–46.0)
Hemoglobin: 14 g/dL (ref 12.0–15.0)
Lymphocytes Relative: 33.5 % (ref 12.0–46.0)
Lymphs Abs: 1.5 10*3/uL (ref 0.7–4.0)
MCHC: 32.7 g/dL (ref 30.0–36.0)
MCV: 90.1 fl (ref 78.0–100.0)
Monocytes Absolute: 0.4 10*3/uL (ref 0.1–1.0)
Monocytes Relative: 8.3 % (ref 3.0–12.0)
Neutro Abs: 2.6 10*3/uL (ref 1.4–7.7)
Neutrophils Relative %: 56.2 % (ref 43.0–77.0)
Platelets: 227 10*3/uL (ref 150.0–400.0)
RBC: 4.76 Mil/uL (ref 3.87–5.11)
RDW: 12.7 % (ref 11.5–15.5)
WBC: 4.6 10*3/uL (ref 4.0–10.5)

## 2023-02-05 LAB — COMPREHENSIVE METABOLIC PANEL
ALT: 6 U/L (ref 0–35)
AST: 15 U/L (ref 0–37)
Albumin: 4.2 g/dL (ref 3.5–5.2)
Alkaline Phosphatase: 72 U/L (ref 39–117)
BUN: 14 mg/dL (ref 6–23)
CO2: 28 mEq/L (ref 19–32)
Calcium: 9.6 mg/dL (ref 8.4–10.5)
Chloride: 102 mEq/L (ref 96–112)
Creatinine, Ser: 0.85 mg/dL (ref 0.40–1.20)
GFR: 81.77 mL/min (ref >60.00)
Glucose, Bld: 85 mg/dL (ref 70–99)
Potassium: 4.6 mEq/L (ref 3.5–5.1)
Sodium: 137 mEq/L (ref 135–145)
Total Bilirubin: 0.9 mg/dL (ref 0.2–1.2)
Total Protein: 7 g/dL (ref 6.0–8.3)

## 2023-02-05 LAB — LIPID PANEL
Cholesterol: 222 mg/dL — ABNORMAL HIGH (ref 0–200)
HDL: 49.2 mg/dL (ref >39.00)
LDL Cholesterol: 162 mg/dL — ABNORMAL HIGH (ref 0–99)
NonHDL: 172.65
Total CHOL/HDL Ratio: 5
Triglycerides: 53 mg/dL (ref 0.0–149.0)
VLDL: 10.6 mg/dL (ref 0.0–40.0)

## 2023-02-05 LAB — TSH: TSH: 2.08 u[IU]/mL (ref 0.35–5.50)

## 2023-02-05 NOTE — Assessment & Plan Note (Signed)
Per derm  Stable

## 2023-02-05 NOTE — Assessment & Plan Note (Signed)
Ghm utd Check labs  See AVS  Health Maintenance  Topic Date Due   COVID-19 Vaccine (1) Never done   MAMMOGRAM  08/31/2022   HIV Screening  11/05/2023 (Originally 12/28/1990)   INFLUENZA VACCINE  02/07/2023   PAP SMEAR-Modifier  07/10/2023   Fecal DNA (Cologuard)  04/10/2025   DTaP/Tdap/Td (3 - Td or Tdap) 11/05/2027   Hepatitis C Screening  Completed   HPV VACCINES  Aged Out

## 2023-02-05 NOTE — Progress Notes (Signed)
Established Patient Office Visit  Subjective   Patient ID: Lydia Davis, female    DOB: 1976/06/08  Age: 47 y.o. MRN: [id number]  Chief Complaint  Patient presents with   Annual Exam    Pt states fasting     HPI Discussed the use of AI scribe software for clinical note transcription with the patient, who gave verbal consent to proceed.  History of Present Illness   The patient presents for an annual physical. She reports doing well on Dupixent for atopic dermatitis, with a significant flare-up last year due to a gap in medication. She sees a dermatologist annually for a full skin check. She also sees a gynecologist annually for a Pap smear and mammogram. The patient reports being in premenopause, with symptoms of waking up around 3 AM. She has increased her exercise regimen, which seems to be helping. She also reports occasional bloating depending on what she eats. The patient has dry eyes, diagnosed two years ago, but does not regularly use drops. She sees a Education officer, community twice a year. The patient completed a Cologuard test in place of a colonoscopy, which is due again in 2026.      Patient Active Problem List   Diagnosis Date Noted   Blurry vision, bilateral 01/30/2021   Panic 07/31/2018   Anxiety 07/31/2018   Pulmonary nodule 07/04/2018   AD (atopic dermatitis) 02/09/2015   Preventative health care 06/17/2014   Past Medical History:  Diagnosis Date   Anxiety    Eczema    History of TB skin testing    postive   Past Surgical History:  Procedure Laterality Date   CESAREAN SECTION  2012   TONSILLECTOMY  1994   Social History   Tobacco Use   Smoking status: Never   Smokeless tobacco: Never  Substance Use Topics   Alcohol use: Yes    Alcohol/week: 0.0 standard drinks of alcohol    Comment: occasionally---glass of wine   Drug use: No   Social History   Socioeconomic History   Marital status: Married    Spouse name: Not on file   Number of children: 1   Years of  education: Not on file   Highest education level: Not on file  Occupational History   Occupation: Registered Nurse    Employer: [employer]    Comment: works in the OR  Tobacco Use   Smoking status: Never   Smokeless tobacco: Never  Substance and Sexual Activity   Alcohol use: Yes    Alcohol/week: 0.0 standard drinks of alcohol    Comment: occasionally---glass of wine   Drug use: No   Sexual activity: Yes    Partners: Male    Birth control/protection: I.U.D.  Other Topics Concern   Not on file  Social History Narrative   Exercise--Walking, and weight s    Social Determinants of Health   Financial Resource Strain: Not on file  Food Insecurity: Not on file  Transportation Needs: Not on file  Physical Activity: Not on file  Stress: Not on file  Social Connections: Not on file  Intimate Partner Violence: Not on file   Family Status  Relation Name Status   Mother  Alive   Father  Deceased       mild dysplastic syndrome   MGM  Deceased   MGF  Deceased   PGM  Deceased   PGF  Deceased   Other  (Not Specified)  No partnership data on file   Family History  Problem Relation  Age of Onset   Hypertension Father        and both sets of grandparents   Psoriasis Father    Diabetes Maternal Grandfather    Hyperlipidemia Paternal Grandfather    Heart attack Paternal Grandfather    Heart disease Other        both sets of grandparents   Allergies  Allergen Reactions   Augmentin [Amoxicillin-Pot Clavulanate] Rash      Review of Systems  Constitutional:  Negative for fever and malaise/fatigue.  HENT:  Negative for congestion.   Eyes:  Negative for blurred vision.  Respiratory:  Negative for shortness of breath.   Cardiovascular:  Negative for chest pain, palpitations and leg swelling.  Gastrointestinal:  Negative for abdominal pain, blood in stool and nausea.  Genitourinary:  Negative for dysuria and frequency.  Musculoskeletal:  Negative for falls.  Skin:  Negative for  rash.  Neurological:  Negative for dizziness, loss of consciousness and headaches.  Endo/Heme/Allergies:  Negative for environmental allergies.  Psychiatric/Behavioral:  Negative for depression. The patient is not nervous/anxious.       Objective:     BP 110/70 (BP Location: Right Arm, Patient Position: Sitting, Cuff Size: Normal)   Pulse 71   Temp 98.7 F (37.1 C) (Oral)   Resp 18   Ht 5\' 8"  (1.727 m)   Wt 154 lb 9.6 oz (70.1 kg)   SpO2 96%   BMI 23.51 kg/m  BP Readings from Last 3 Encounters:  02/05/23 110/70  09/06/22 117/79  02/01/22 118/80   Wt Readings from Last 3 Encounters:  02/05/23 154 lb 9.6 oz (70.1 kg)  02/01/22 157 lb (71.2 kg)  09/19/21 158 lb (71.7 kg)   SpO2 Readings from Last 3 Encounters:  02/05/23 96%  09/06/22 97%  02/01/22 97%      Physical Exam Vitals and nursing note reviewed.  Constitutional:      General: She is not in acute distress.    Appearance: Normal appearance. She is well-developed.  HENT:     Head: Normocephalic and atraumatic.     Right Ear: Tympanic membrane, ear canal and external ear normal. There is no impacted cerumen.     Left Ear: Tympanic membrane, ear canal and external ear normal. There is no impacted cerumen.     Nose: Nose normal.     Mouth/Throat:     Mouth: Mucous membranes are moist.     Pharynx: Oropharynx is clear. No oropharyngeal exudate or posterior oropharyngeal erythema.  Eyes:     General: No scleral icterus.       Right eye: No discharge.        Left eye: No discharge.     Conjunctiva/sclera: Conjunctivae normal.     Pupils: Pupils are equal, round, and reactive to light.  Neck:     Thyroid: No thyromegaly or thyroid tenderness.     Vascular: No JVD.  Cardiovascular:     Rate and Rhythm: Normal rate and regular rhythm.     Heart sounds: Normal heart sounds. No murmur heard. Pulmonary:     Effort: Pulmonary effort is normal. No respiratory distress.     Breath sounds: Normal breath sounds.   Abdominal:     General: Bowel sounds are normal. There is no distension.     Palpations: Abdomen is soft. There is no mass.     Tenderness: There is no abdominal tenderness. There is no guarding or rebound.  Genitourinary:    Vagina: Normal.  Musculoskeletal:  General: Normal range of motion.     Cervical back: Normal range of motion and neck supple.     Right lower leg: No edema.     Left lower leg: No edema.  Lymphadenopathy:     Cervical: No cervical adenopathy.  Skin:    General: Skin is warm and dry.     Findings: No erythema or rash.  Neurological:     Mental Status: She is alert and oriented to person, place, and time.     Cranial Nerves: No cranial nerve deficit.     Deep Tendon Reflexes: Reflexes are normal and symmetric.  Psychiatric:        Mood and Affect: Mood normal.        Behavior: Behavior normal.        Thought Content: Thought content normal.        Judgment: Judgment normal.      No results found for any visits on 02/05/23.  Last CBC Lab Results  Component Value Date   WBC 5.1 02/01/2022   HGB 13.8 02/01/2022   HCT 42.0 02/01/2022   MCV 90.4 02/01/2022   MCH 29.9 08/02/2018   RDW 12.7 02/01/2022   PLT 191.0 02/01/2022   Last metabolic panel Lab Results  Component Value Date   GLUCOSE 86 02/01/2022   NA 139 02/01/2022   K 5.0 02/01/2022   CL 103 02/01/2022   CO2 31 02/01/2022   BUN 14 02/01/2022   CREATININE 0.83 02/01/2022   GFR 84.73 02/01/2022   CALCIUM 9.4 02/01/2022   PROT 7.3 02/01/2022   ALBUMIN 4.3 02/01/2022   BILITOT 1.0 02/01/2022   ALKPHOS 68 02/01/2022   AST 21 02/01/2022   ALT 10 02/01/2022   ANIONGAP 10 08/02/2018   Last lipids Lab Results  Component Value Date   CHOL 228 (H) 02/01/2022   HDL 47.80 02/01/2022   LDLCALC 166 (H) 02/01/2022   TRIG 67.0 02/01/2022   CHOLHDL 5 02/01/2022   Last hemoglobin A1c No results found for: "HGBA1C" Last thyroid functions Lab Results  Component Value Date   TSH  1.61 02/01/2022   T4TOTAL 10.9 02/01/2015   Last vitamin D Lab Results  Component Value Date   VD25OH 37.92 12/06/2015   Last vitamin B12 and Folate Lab Results  Component Value Date   VITAMINB12 785 04/10/2018      The 10-year ASCVD risk score (Arnett DK, et al., 2019) is: 1.1%    Assessment & Plan:   Problem List Items Addressed This Visit       Unprioritized   Preventative health care - Primary    Ghm utd Check labs  See AVS  Health Maintenance  Topic Date Due   COVID-19 Vaccine (1) Never done   MAMMOGRAM  08/31/2022   HIV Screening  11/05/2023 (Originally [birth year]28/1990)   INFLUENZA VACCINE  02/07/2023   PAP SMEAR-Modifier  07/10/2023   Fecal DNA (Cologuard)  04/10/2025   DTaP/Tdap/Td (3 - Td or Tdap) 11/05/2027   Hepatitis C Screening  Completed   HPV VACCINES  Aged Out         Relevant Orders   CBC with Differential/Platelet   Comprehensive metabolic panel   Lipid panel   TSH   Anxiety    stable      AD (atopic dermatitis)    Per derm  Stable       Other Visit Diagnoses     Hyperlipidemia, unspecified hyperlipidemia type       Relevant Orders  Comprehensive metabolic panel   Lipid panel   Panic attacks       Atopic dermatitis in adult           No follow-ups on file.    Donato Schultz, DO

## 2023-02-05 NOTE — Assessment & Plan Note (Signed)
stable °

## 2023-02-11 ENCOUNTER — Other Ambulatory Visit (HOSPITAL_COMMUNITY): Payer: Self-pay

## 2023-03-04 ENCOUNTER — Other Ambulatory Visit: Payer: Self-pay

## 2023-03-05 ENCOUNTER — Other Ambulatory Visit (HOSPITAL_COMMUNITY): Payer: Self-pay

## 2023-03-30 ENCOUNTER — Encounter (HOSPITAL_COMMUNITY): Payer: Self-pay

## 2023-04-02 ENCOUNTER — Other Ambulatory Visit (HOSPITAL_COMMUNITY): Payer: Self-pay

## 2023-04-02 ENCOUNTER — Other Ambulatory Visit: Payer: Self-pay

## 2023-04-02 NOTE — Progress Notes (Signed)
Specialty Pharmacy Refill Coordination Note  Lydia Davis is a 47 y.o. female contacted today regarding refills of specialty medication(s) Dupilumab .  Patient requested Delivery  on 04/10/23  to verified address Patient address [address]  [place] [address]   Medication will be filled on 04/09/23.

## 2023-05-02 ENCOUNTER — Other Ambulatory Visit (HOSPITAL_COMMUNITY): Payer: Self-pay

## 2023-05-02 ENCOUNTER — Other Ambulatory Visit: Payer: Self-pay

## 2023-05-02 NOTE — Progress Notes (Signed)
Specialty Pharmacy Refill Coordination Note  Lydia Davis is a 47 y.o. female contacted today regarding refills of specialty medication(s) Dupilumab   Patient requested Delivery   Delivery date: 05/08/23   Verified address: [address]    [place] [address]   Medication will be filled on 05/07/23.

## 2023-05-07 ENCOUNTER — Other Ambulatory Visit (HOSPITAL_COMMUNITY): Payer: Self-pay

## 2023-05-27 ENCOUNTER — Other Ambulatory Visit (HOSPITAL_COMMUNITY): Payer: Self-pay | Admitting: Pharmacy Technician

## 2023-05-27 ENCOUNTER — Other Ambulatory Visit: Payer: Self-pay

## 2023-05-27 ENCOUNTER — Other Ambulatory Visit (HOSPITAL_COMMUNITY): Payer: Self-pay

## 2023-05-27 NOTE — Progress Notes (Signed)
Specialty Pharmacy Refill Coordination Note  Danel Setty is a 47 y.o. female contacted today regarding refills of specialty medication(s) Dupilumab   Patient requested Delivery   Delivery date: 06/05/23   Verified address: [address] [place]   Medication will be filled on 06/04/23.

## 2023-05-31 ENCOUNTER — Encounter: Payer: Self-pay | Admitting: Family Medicine

## 2023-05-31 ENCOUNTER — Ambulatory Visit (INDEPENDENT_AMBULATORY_CARE_PROVIDER_SITE_OTHER): Payer: 59 | Admitting: Family Medicine

## 2023-05-31 VITALS — BP 120/88 | HR 67 | Temp 98.8°F | Resp 18 | Ht 68.0 in | Wt 156.4 lb

## 2023-05-31 DIAGNOSIS — F418 Other specified anxiety disorders: Secondary | ICD-10-CM

## 2023-05-31 DIAGNOSIS — F410 Panic disorder [episodic paroxysmal anxiety] without agoraphobia: Secondary | ICD-10-CM | POA: Diagnosis not present

## 2023-05-31 DIAGNOSIS — F419 Anxiety disorder, unspecified: Secondary | ICD-10-CM

## 2023-05-31 MED ORDER — ALPRAZOLAM 0.25 MG PO TABS
0.2500 mg | ORAL_TABLET | Freq: Two times a day (BID) | ORAL | 0 refills | Status: AC | PRN
Start: 2023-05-31 — End: ?
  Filled 2023-05-31: qty 20, 10d supply, fill #0

## 2023-05-31 MED ORDER — ESCITALOPRAM OXALATE 10 MG PO TABS
10.0000 mg | ORAL_TABLET | Freq: Every day | ORAL | 3 refills | Status: AC
Start: 2023-05-31 — End: ?
  Filled 2023-05-31: qty 90, 90d supply, fill #0
  Filled 2023-08-29: qty 90, 90d supply, fill #1
  Filled 2023-12-03: qty 90, 90d supply, fill #2
  Filled 2024-03-31: qty 90, 90d supply, fill #3

## 2023-05-31 NOTE — Patient Instructions (Signed)
Managing Depression, Adult Depression is a mental health condition that affects your thoughts, feelings, and actions. Being diagnosed with depression can bring you relief if you did not know why you have felt or behaved a certain way. It could also leave you feeling overwhelmed. Finding ways to manage your symptoms can help you feel more positive about your future. How to manage lifestyle changes Being depressed is difficult. Depression can increase the level of everyday stress. Stress can make depression symptoms worse. You may believe your symptoms cannot be managed or will never improve. However, there are many things you can try to help manage your symptoms. There is hope. Managing stress  Stress is your body's reaction to life changes and events, both good and bad. Stress can add to your feelings of depression. Learning to manage your stress can help lessen your feelings of depression. Try some of the following approaches to reducing your stress (stress reduction techniques): Listen to music that you enjoy and that inspires you. Try using a meditation app or take a meditation class. Develop a practice that helps you connect with your spiritual self. Walk in nature, pray, or go to a place of worship. Practice deep breathing. To do this, inhale slowly through your nose. Pause at the top of your inhale for a few seconds and then exhale slowly, letting yourself relax. Repeat this three or four times. Practice yoga to help relax and work your muscles. Choose a stress reduction technique that works for you. These techniques take time and practice to develop. Set aside 5-15 minutes a day to do them. Therapists can offer training in these techniques. Do these things to help manage stress: Keep a journal. Know your limits. Set healthy boundaries for yourself and others, such as saying "no" when you think something is too much. Pay attention to how you react to certain situations. You may not be able to  control everything, but you can change your reaction. Add humor to your life by watching funny movies or shows. Make time for activities that you enjoy and that relax you. Spend less time using electronics, especially at night before bed. The light from screens can make your brain think it is time to get up rather than go to bed.  Medicines Medicines, such as antidepressants, are often a part of treatment for depression. Talk with your pharmacist or health care provider about all the medicines, supplements, and herbal products that you take, their possible side effects, and what medicines and other products are safe to take together. Make sure to report any side effects you may have to your health care provider. Relationships Your health care provider may suggest family therapy, couples therapy, or individual therapy as part of your treatment. How to recognize changes Everyone responds differently to treatment for depression. As you recover from depression, you may start to: Have more interest in doing activities. Feel more hopeful. Have more energy. Eat a more regular amount of food. Have better mental focus. It is important to recognize if your depression is not getting better or is getting worse. The symptoms you had in the beginning may return, such as: Feeling tired. Eating too much or too little. Sleeping too much or too little. Feeling restless, agitated, or hopeless. Trouble focusing or making decisions. Having unexplained aches and pains. Feeling irritable, angry, or aggressive. If you or your family members notice these symptoms coming back, let your health care provider know right away. Follow these instructions at home: Activity Try to  get some form of exercise each day, such as walking. Try yoga, mindfulness, or other stress reduction techniques. Participate in group activities if you are able. Lifestyle Get enough sleep. Cut down on or stop using caffeine, tobacco,  alcohol, and any other harmful substances. Eat a healthy diet that includes plenty of vegetables, fruits, whole grains, low-fat dairy products, and lean protein. Limit foods that are high in solid fats, added sugar, or salt (sodium). General instructions Take over-the-counter and prescription medicines only as told by your health care provider. Keep all follow-up visits. It is important for your health care provider to check on your mood, behavior, and medicines. Your health care provider may need to make changes to your treatment. Where to find support Talking to others  Friends and family members can be sources of support and guidance. Talk to trusted friends or family members about your condition. Explain your symptoms and let them know that you are working with a health care provider to treat your depression. Tell friends and family how they can help. Finances Find mental health providers that fit with your financial situation. Talk with your health care provider if you are worried about access to food, housing, or medicine. Call your insurance company to learn about your co-pays and prescription plan. Where to find more information You can find support in your area from: Anxiety and Depression Association of America (ADAA): adaa.org Mental Health America: mentalhealthamerica.net The [employer] on Mental Illness: nami.org Contact a health care provider if: You stop taking your antidepressant medicines, and you have any of these symptoms: Nausea. Headache. Light-headedness. Chills and body aches. Not being able to sleep (insomnia). You or your friends and family think your depression is getting worse. Get help right away if: You have thoughts of hurting yourself or others. Get help right away if you feel like you may hurt yourself or others, or have thoughts about taking your own life. Go to your nearest emergency room or: Call 911. Call the National Suicide Prevention Lifeline at  408-154-1716 or 988. This is open 24 hours a day. Text the Crisis Text Line at 508 372 4366. This information is not intended to replace advice given to you by your health care provider. Make sure you discuss any questions you have with your health care provider. Document Revised: 10/31/2021 Document Reviewed: 10/31/2021 Elsevier Patient Education  2024 [employer].

## 2023-05-31 NOTE — Progress Notes (Signed)
Established Patient Office Visit  Subjective   Patient ID: Lydia Davis, female    DOB: 06/06/1976  Age: 47 y.o. MRN: [id number]  Chief Complaint  Patient presents with   Depression    HPI Discussed the use of AI scribe software for clinical note transcription with the patient, who gave verbal consent to proceed.  History of Present Illness   The patient, a registered nurse, presents with worsening depression and anxiety. She reports feeling irritable and struggling with negative emotions, despite maintaining a healthy lifestyle with regular exercise and a clean diet. The patient had previously been on Lexapro, which she discontinued in an attempt to manage her symptoms without medication. However, she reports that her negative feelings have gradually returned and are now at a point where she has to force herself to do things and find little enjoyment in activities. The patient denies any issues with weight gain while on Lexapro.  The patient's work situation is contributing to her stress. 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 She expresses a desire to find a new job, preferably outside of the operating room and in a low-stress environment. She has been exploring different positions within her field, including potential hybrid positions that would allow her to work from home part of the time.  The patient also mentions that she is in premenopause, as confirmed by her gynecologist. She has been experiencing sleepless nights, which she attributes to this transition. She expresses concern about her hormonal and emotional regulation during this time.  The patient has a prescription for Xanax, but reports not using it regularly. She expresses concern about becoming dependent on medication, but is open to restarting Lexapro at a lower dose due to her worsening symptoms.      Patient Active Problem List    Diagnosis Date Noted   Blurry vision, bilateral 01/30/2021   Panic 07/31/2018   Anxiety 07/31/2018   Pulmonary nodule 07/04/2018   AD (atopic dermatitis) 02/09/2015   Preventative health care 06/17/2014   Past Medical History:  Diagnosis Date   Anxiety    Eczema    History of TB skin testing    postive   Past Surgical History:  Procedure Laterality Date   CESAREAN SECTION  2012   TONSILLECTOMY  1994   Social History   Tobacco Use   Smoking status: Never   Smokeless tobacco: Never  Substance Use Topics   Alcohol use: Yes    Alcohol/week: 0.0 standard drinks of alcohol    Comment: occasionally---glass of wine   Drug use: No   Social History   Socioeconomic History   Marital status: Married    Spouse name: Not on file   Number of children: 1   Years of education: Not on file   Highest education level: Bachelor's degree (e.g., BA, AB, BS)  Occupational History   Occupation: Copywriter, advertising: [employer]    Comment: works in the OR  Tobacco Use   Smoking status: Never   Smokeless tobacco: Never  Substance and Sexual Activity   Alcohol use: Yes    Alcohol/week: 0.0 standard drinks of alcohol    Comment: occasionally---glass of wine   Drug use: No   Sexual activity: Yes    Partners: Male    Birth control/protection: I.U.D.  Other Topics Concern   Not on file  Social History Narrative   Exercise--Walking, and weight s  Social Determinants of Health   Financial Resource Strain: Low Risk  (05/30/2023)   Overall Financial Resource Strain (CARDIA)    Difficulty of Paying Living Expenses: Not hard at all  Food Insecurity: No Food Insecurity (05/30/2023)   Hunger Vital Sign    Worried About Running Out of Food in the Last Year: Never true    Ran Out of Food in the Last Year: Never true  Transportation Needs: No Transportation Needs (05/30/2023)   PRAPARE - Administrator, Civil Service (Medical): No    Lack of Transportation  (Non-Medical): No  Physical Activity: Not on file  Stress: Not on file  Social Connections: Unknown (05/30/2023)   Social Connection and Isolation Panel [NHANES]    Frequency of Communication with Friends and Family: Twice a week    Frequency of Social Gatherings with Friends and Family: Patient declined    Attends Religious Services: Never    Diplomatic Services operational officer: No    Attends Engineer, structural: Not on file    Marital Status: Married  Catering manager Violence: Not on file   Family Status  Relation Name Status   Mother  Alive   Father  Deceased       mild dysplastic syndrome   MGM  Deceased   MGF  Deceased   PGM  Deceased   PGF  Deceased   Other  (Not Specified)  No partnership data on file   Family History  Problem Relation Age of Onset   Hypertension Father        and both sets of grandparents   Psoriasis Father    Diabetes Maternal Grandfather    Hyperlipidemia Paternal Grandfather    Heart attack Paternal Grandfather    Heart disease Other        both sets of grandparents   Allergies  Allergen Reactions   Augmentin [Amoxicillin-Pot Clavulanate] Rash      Review of Systems  Constitutional:  Negative for fever and malaise/fatigue.  HENT:  Negative for congestion.   Eyes:  Negative for blurred vision.  Respiratory:  Negative for cough and shortness of breath.   Cardiovascular:  Negative for chest pain, palpitations and leg swelling.  Gastrointestinal:  Negative for abdominal pain, blood in stool, nausea and vomiting.  Genitourinary:  Negative for dysuria and frequency.  Musculoskeletal:  Negative for back pain and falls.  Skin:  Negative for rash.  Neurological:  Negative for dizziness, loss of consciousness and headaches.  Endo/Heme/Allergies:  Negative for environmental allergies.  Psychiatric/Behavioral:  Positive for depression. The patient is nervous/anxious.       Objective:     BP 120/88 (BP Location: Right Arm,  Patient Position: Sitting, Cuff Size: Normal)   Pulse 67   Temp 98.8 F (37.1 C) (Oral)   Resp 18   Ht 5\' 8"  (1.727 m)   Wt 156 lb 6.4 oz (70.9 kg)   SpO2 98%   BMI 23.78 kg/m  BP Readings from Last 3 Encounters:  05/31/23 120/88  02/05/23 110/70  09/06/22 117/79   Wt Readings from Last 3 Encounters:  05/31/23 156 lb 6.4 oz (70.9 kg)  02/05/23 154 lb 9.6 oz (70.1 kg)  02/01/22 157 lb (71.2 kg)   SpO2 Readings from Last 3 Encounters:  05/31/23 98%  02/05/23 96%  09/06/22 97%      Physical Exam Vitals and nursing note reviewed.  Constitutional:      General: She is not in acute distress.  Appearance: Normal appearance. She is well-developed.  HENT:     Head: Normocephalic and atraumatic.  Eyes:     General: No scleral icterus.       Right eye: No discharge.        Left eye: No discharge.  Cardiovascular:     Rate and Rhythm: Normal rate and regular rhythm.     Heart sounds: No murmur heard. Pulmonary:     Effort: Pulmonary effort is normal. No respiratory distress.     Breath sounds: Normal breath sounds.  Musculoskeletal:        General: Normal range of motion.     Cervical back: Normal range of motion and neck supple.     Right lower leg: No edema.     Left lower leg: No edema.  Skin:    General: Skin is warm and dry.  Neurological:     General: No focal deficit present.     Mental Status: She is alert and oriented to person, place, and time.  Psychiatric:        Mood and Affect: Mood is anxious and depressed.        Behavior: Behavior normal.        Thought Content: Thought content normal.        Judgment: Judgment normal.      No results found for any visits on 05/31/23.  Last CBC Lab Results  Component Value Date   WBC 4.6 02/05/2023   HGB 14.0 02/05/2023   HCT 42.9 02/05/2023   MCV 90.1 02/05/2023   MCH 29.9 08/02/2018   RDW 12.7 02/05/2023   PLT 227.0 02/05/2023   Last metabolic panel Lab Results  Component Value Date   GLUCOSE 85  02/05/2023   NA 137 02/05/2023   K 4.6 02/05/2023   CL 102 02/05/2023   CO2 28 02/05/2023   BUN 14 02/05/2023   CREATININE 0.85 02/05/2023   GFR 81.77 02/05/2023   CALCIUM 9.6 02/05/2023   PROT 7.0 02/05/2023   ALBUMIN 4.2 02/05/2023   BILITOT 0.9 02/05/2023   ALKPHOS 72 02/05/2023   AST 15 02/05/2023   ALT 6 02/05/2023   ANIONGAP 10 08/02/2018   Last lipids Lab Results  Component Value Date   CHOL 222 (H) 02/05/2023   HDL 49.20 02/05/2023   LDLCALC 162 (H) 02/05/2023   TRIG 53.0 02/05/2023   CHOLHDL 5 02/05/2023   Last hemoglobin A1c No results found for: "HGBA1C" Last thyroid functions Lab Results  Component Value Date   TSH 2.08 02/05/2023   T4TOTAL 10.9 02/01/2015   Last vitamin D Lab Results  Component Value Date   VD25OH 37.92 12/06/2015   Last vitamin B12 and Folate Lab Results  Component Value Date   VITAMINB12 785 04/10/2018      The 10-year ASCVD risk score (Arnett DK, et al., 2019) is: 1.2%    Assessment & Plan:   Problem List Items Addressed This Visit       Unprioritized   Anxiety   Relevant Medications   escitalopram (LEXAPRO) 10 MG tablet   ALPRAZolam (XANAX) 0.25 MG tablet   Other Visit Diagnoses     Depression with anxiety    -  Primary   Relevant Medications   escitalopram (LEXAPRO) 10 MG tablet   ALPRAZolam (XANAX) 0.25 MG tablet   Panic attacks       Relevant Medications   escitalopram (LEXAPRO) 10 MG tablet   ALPRAZolam (XANAX) 0.25 MG tablet     Assessment and  Plan    Depression   She is experiencing worsening depression after discontinuing Lexapro, with symptoms including irritability, anhedonia, and difficulty with motivation. Despite initial improvement with increased exercise and clean eating, she struggles to regulate emotions. She has concerns about weight gain with medication but did not experience this with Lexapro previously and prefers to restart Lexapro at a lower dose due to previous tiredness at higher  doses. We will restart Lexapro 10 mg daily and schedule a follow-up in 6-8 weeks; a virtual appointment is acceptable. We discussed the potential use of Xanax for sleepless nights until Lexapro takes effect but advised against daily use.  Anxiety   Her anxiety has increased, related to her work schedule and job dissatisfaction, leading to insomnia, especially before workdays. She has Xanax but prefers not to use it daily to avoid dependency. We will prescribe a new Xanax prescription and advise taking half a Xanax on nights with increased anxiety, particularly before workdays, emphasizing the importance of not using Xanax daily to avoid dependency.  Premenopause   She exhibits symptoms consistent with premenopause, including insomnia, and was informed by her gynecologist of her premenopausal status. We discussed that Lexapro could help with premenopausal symptoms once it takes effect.  Job Dissatisfaction/Burnout   She expresses significant dissatisfaction and burnout with her current job in the operating room, working off-shift hours and weekends. She is exploring other job opportunities that may offer a lower stress environment, such as Editor, commissioning lead, hybrid positions, school nurse, or private duty. We encouraged her to find a job that aligns with her interests and offers a better work-life balance.  Follow-up   We scheduled a follow-up appointment in 6-8 weeks; a virtual appointment is acceptable, and advised her to contact the office if any issues arise before the scheduled follow-up.        Return in about 6 weeks (around 07/12/2023), or if symptoms worsen or fail to improve, for depression , anxiety.    Donato Schultz, DO

## 2023-06-01 ENCOUNTER — Other Ambulatory Visit (HOSPITAL_COMMUNITY): Payer: Self-pay

## 2023-06-02 ENCOUNTER — Other Ambulatory Visit: Payer: Self-pay

## 2023-06-04 ENCOUNTER — Other Ambulatory Visit: Payer: Self-pay

## 2023-06-04 ENCOUNTER — Other Ambulatory Visit (HOSPITAL_COMMUNITY): Payer: Self-pay

## 2023-06-28 ENCOUNTER — Other Ambulatory Visit: Payer: Self-pay

## 2023-06-28 NOTE — Progress Notes (Signed)
Specialty Pharmacy Refill Coordination Note  Lydia Davis is a 47 y.o. female contacted today regarding refills of specialty medication(s) Dupilumab (Dupixent)   Patient requested Delivery   Delivery date: 07/05/23   Verified address: [address] [place]   Medication will be filled on 12.26.24.

## 2023-07-04 ENCOUNTER — Other Ambulatory Visit: Payer: Self-pay

## 2023-07-29 ENCOUNTER — Other Ambulatory Visit: Payer: Self-pay

## 2023-07-29 NOTE — Progress Notes (Signed)
Specialty Pharmacy Refill Coordination Note  Lydia Davis is a 48 y.o. female contacted today regarding refills of specialty medication(s) Dupilumab (Dupixent)   Patient requested Delivery   Delivery date: 08/07/23   Verified address: [address] [place]   Medication will be filled on 08/06/23.

## 2023-07-31 ENCOUNTER — Other Ambulatory Visit: Payer: Self-pay | Admitting: Pharmacist

## 2023-07-31 ENCOUNTER — Ambulatory Visit: Payer: 59 | Attending: Family Medicine | Admitting: Pharmacist

## 2023-07-31 ENCOUNTER — Encounter (HOSPITAL_COMMUNITY): Payer: Self-pay

## 2023-07-31 DIAGNOSIS — Z79899 Other long term (current) drug therapy: Secondary | ICD-10-CM

## 2023-07-31 NOTE — Progress Notes (Signed)
Please see OV from 07/31/2023 for complete documentation.   Butch Penny, PharmD, Patsy [place], CPP Clinical Pharmacist [facility] [phone]

## 2023-07-31 NOTE — Progress Notes (Signed)
   S: Patient presents for review of their specialty medication therapy.   Patient is currently taking Dupixent for atopic dermatitis. Patient is managed by Dr. Sharyn Lull for this.   Adherence: confirms  Efficacy: reports that it works well for her   Dosing: 300mg  q14days  Dose adjustments: Renal: no dose adjustments (has not been studied) Hepatic: no dose adjustments (has not been studied)  Drug-drug interactions: none identified  Monitoring: S/sx of infection: none S/sx of hypersensitivity: none S/sx of ocular effects: none  S/sx of eosinophilia/vasculitis: none  O:     Lab Results  Component Value Date   WBC 4.6 02/05/2023   HGB 14.0 02/05/2023   HCT 42.9 02/05/2023   MCV 90.1 02/05/2023   PLT 227.0 02/05/2023      Chemistry      Component Value Date/Time   NA 137 02/05/2023 1014   K 4.6 02/05/2023 1014   CL 102 02/05/2023 1014   CO2 28 02/05/2023 1014   BUN 14 02/05/2023 1014   CREATININE 0.85 02/05/2023 1014      Component Value Date/Time   CALCIUM 9.6 02/05/2023 1014   ALKPHOS 72 02/05/2023 1014   AST 15 02/05/2023 1014   ALT 6 02/05/2023 1014   BILITOT 0.9 02/05/2023 1014       A/P: 1. Medication review: Patient currently on Dupixent for atopic dermatitis. Reviewed the medication with the patient, including the following: Dupixent is a monoclonal antibody used for the treatment of asthma or atopic dermatitis. Patient educated on purpose, proper use and potential adverse effects of Dupixent. Possible adverse effects include increased risk of infection, ocular effects, vasculitis/eosinophilia, and hypersensitivity reactions. Administer as a SubQ injection and rotate sites. Allow the medication to reach room temp prior to administration (45 mins for 300 mg syringe or 30 min for 200 mg syringe). Do not shake. Discard any unused portion. No recommendations for any changes.   Butch Penny, PharmD, Patsy [place], CPP Clinical Pharmacist [facility]  [facility] [phone]

## 2023-08-06 ENCOUNTER — Other Ambulatory Visit: Payer: Self-pay

## 2023-08-06 ENCOUNTER — Other Ambulatory Visit (HOSPITAL_COMMUNITY): Payer: Self-pay

## 2023-08-07 ENCOUNTER — Other Ambulatory Visit: Payer: Self-pay

## 2023-08-08 ENCOUNTER — Other Ambulatory Visit: Payer: Self-pay

## 2023-08-12 DIAGNOSIS — D485 Neoplasm of uncertain behavior of skin: Secondary | ICD-10-CM | POA: Diagnosis not present

## 2023-08-12 DIAGNOSIS — D225 Melanocytic nevi of trunk: Secondary | ICD-10-CM | POA: Diagnosis not present

## 2023-08-12 DIAGNOSIS — D2239 Melanocytic nevi of other parts of face: Secondary | ICD-10-CM | POA: Diagnosis not present

## 2023-08-12 DIAGNOSIS — D0359 Melanoma in situ of other part of trunk: Secondary | ICD-10-CM | POA: Diagnosis not present

## 2023-08-12 DIAGNOSIS — L2084 Intrinsic (allergic) eczema: Secondary | ICD-10-CM | POA: Diagnosis not present

## 2023-08-12 DIAGNOSIS — L814 Other melanin hyperpigmentation: Secondary | ICD-10-CM | POA: Diagnosis not present

## 2023-08-12 DIAGNOSIS — L578 Other skin changes due to chronic exposure to nonionizing radiation: Secondary | ICD-10-CM | POA: Diagnosis not present

## 2023-08-26 DIAGNOSIS — D0359 Melanoma in situ of other part of trunk: Secondary | ICD-10-CM | POA: Diagnosis not present

## 2023-08-29 ENCOUNTER — Other Ambulatory Visit: Payer: Self-pay

## 2023-08-29 ENCOUNTER — Other Ambulatory Visit (HOSPITAL_COMMUNITY): Payer: Self-pay

## 2023-08-29 NOTE — Progress Notes (Signed)
 Specialty Pharmacy Refill Coordination Note  Lydia Davis is a 48 y.o. female contacted today regarding refills of specialty medication(s) Dupilumab (Dupixent)   Patient requested Delivery   Delivery date: 09/06/23   Verified address: [address] [place]   Medication will be filled on 09/05/23.

## 2023-09-05 ENCOUNTER — Other Ambulatory Visit: Payer: Self-pay

## 2023-09-11 DIAGNOSIS — Z01419 Encounter for gynecological examination (general) (routine) without abnormal findings: Secondary | ICD-10-CM | POA: Diagnosis not present

## 2023-09-11 DIAGNOSIS — Z6823 Body mass index (BMI) 23.0-23.9, adult: Secondary | ICD-10-CM | POA: Diagnosis not present

## 2023-09-11 DIAGNOSIS — N926 Irregular menstruation, unspecified: Secondary | ICD-10-CM | POA: Diagnosis not present

## 2023-09-11 DIAGNOSIS — Z1231 Encounter for screening mammogram for malignant neoplasm of breast: Secondary | ICD-10-CM | POA: Diagnosis not present

## 2023-09-11 LAB — HM PAP SMEAR

## 2023-09-26 ENCOUNTER — Other Ambulatory Visit: Payer: Self-pay

## 2023-09-26 ENCOUNTER — Other Ambulatory Visit (HOSPITAL_COMMUNITY): Payer: Self-pay

## 2023-09-26 NOTE — Progress Notes (Signed)
 Clinical Intervention Note  Clinical Intervention Notes: Patient reported initiating treatment with Fish Oil and Magnesium. No DDI's identified with Dupixent.   Clinical Intervention Outcomes: Prevention of an adverse drug event   Otto Herb Specialty Pharmacist

## 2023-09-26 NOTE — Progress Notes (Signed)
 Specialty Pharmacy Refill Coordination Note  Lydia Davis is a 48 y.o. female contacted today regarding refills of specialty medication(s) Dupixent.  Patient requested (Patient-Rptd) Delivery   Delivery date: (Patient-Rptd) 10/04/23   Verified address: (Patient-Rptd) [address], [place] [address]   Medication will be filled on 10/03/23.

## 2023-10-03 ENCOUNTER — Other Ambulatory Visit: Payer: Self-pay

## 2023-10-10 DIAGNOSIS — L820 Inflamed seborrheic keratosis: Secondary | ICD-10-CM | POA: Diagnosis not present

## 2023-10-10 DIAGNOSIS — D485 Neoplasm of uncertain behavior of skin: Secondary | ICD-10-CM | POA: Diagnosis not present

## 2023-10-10 DIAGNOSIS — D2271 Melanocytic nevi of right lower limb, including hip: Secondary | ICD-10-CM | POA: Diagnosis not present

## 2023-10-10 DIAGNOSIS — D229 Melanocytic nevi, unspecified: Secondary | ICD-10-CM | POA: Diagnosis not present

## 2023-10-28 ENCOUNTER — Other Ambulatory Visit: Payer: Self-pay

## 2023-10-28 ENCOUNTER — Other Ambulatory Visit: Payer: Self-pay | Admitting: Pharmacy Technician

## 2023-10-28 NOTE — Progress Notes (Signed)
 Specialty Pharmacy Refill Coordination Note  Lydia Davis is a 48 y.o. female contacted today regarding refills of specialty medication(s) Dupilumab  (Dupixent )   Patient requested (Patient-Rptd) Delivery   Delivery date: (Patient-Rptd) 11/04/23 New Delivery Date 11/05/23 Due to this being a Fridge item. Spoke with patient & aware  Verified address: (Patient-Rptd) [address] [place] [address]   Medication will be filled on 11/04/23.

## 2023-11-22 ENCOUNTER — Other Ambulatory Visit: Payer: Self-pay

## 2023-11-25 ENCOUNTER — Other Ambulatory Visit: Payer: Self-pay

## 2023-11-27 ENCOUNTER — Other Ambulatory Visit: Payer: Self-pay

## 2023-11-29 ENCOUNTER — Other Ambulatory Visit (HOSPITAL_COMMUNITY): Payer: Self-pay

## 2023-12-03 ENCOUNTER — Other Ambulatory Visit: Payer: Self-pay

## 2023-12-03 ENCOUNTER — Other Ambulatory Visit (HOSPITAL_COMMUNITY): Payer: Self-pay

## 2023-12-03 NOTE — Progress Notes (Signed)
 Specialty Pharmacy Refill Coordination Note  Lydia Davis is a 48 y.o. female contacted today regarding refills of specialty medication(s) Dupilumab  (Dupixent )   Patient requested Delivery   Delivery date: 12/04/23   Verified address: [address]   [place] [address]   Medication will be filled on 12/03/23.

## 2023-12-04 ENCOUNTER — Encounter: Payer: Self-pay | Admitting: Pharmacist

## 2023-12-04 ENCOUNTER — Other Ambulatory Visit: Payer: Self-pay

## 2023-12-05 ENCOUNTER — Other Ambulatory Visit (HOSPITAL_COMMUNITY): Payer: Self-pay

## 2023-12-13 DIAGNOSIS — D229 Melanocytic nevi, unspecified: Secondary | ICD-10-CM | POA: Diagnosis not present

## 2023-12-13 DIAGNOSIS — Z85820 Personal history of malignant melanoma of skin: Secondary | ICD-10-CM | POA: Diagnosis not present

## 2023-12-24 ENCOUNTER — Other Ambulatory Visit: Payer: Self-pay

## 2023-12-26 ENCOUNTER — Other Ambulatory Visit: Payer: Self-pay

## 2023-12-26 ENCOUNTER — Other Ambulatory Visit: Payer: Self-pay | Admitting: Pharmacy Technician

## 2023-12-26 NOTE — Progress Notes (Signed)
 Specialty Pharmacy Refill Coordination Note  Lydia Davis is a 48 y.o. female contacted today regarding refills of specialty medication(s) Dupilumab  (Dupixent )   Patient requested Delivery   Delivery date: 12/31/23   Verified address: [address]  [place]   Medication will be filled on 12/30/23.  This fill date is pending response to refill request from provider. Patient is aware and if they have not received fill by intended date they must follow up with pharmacy.

## 2023-12-30 ENCOUNTER — Other Ambulatory Visit: Payer: Self-pay

## 2024-01-02 ENCOUNTER — Other Ambulatory Visit (HOSPITAL_COMMUNITY): Payer: Self-pay

## 2024-01-02 ENCOUNTER — Other Ambulatory Visit: Payer: Self-pay

## 2024-01-02 ENCOUNTER — Other Ambulatory Visit: Payer: Self-pay | Admitting: Pharmacist

## 2024-01-02 MED ORDER — DUPIXENT 300 MG/2ML ~~LOC~~ SOAJ: SUBCUTANEOUS | 5 refills | Status: DC

## 2024-01-02 MED ORDER — DUPIXENT 300 MG/2ML ~~LOC~~ SOAJ
SUBCUTANEOUS | 5 refills | Status: DC
  Filled 2024-01-02: qty 4, 28d supply, fill #0
  Filled 2024-01-29: qty 4, 28d supply, fill #1
  Filled 2024-02-24 (×2): qty 4, 28d supply, fill #2
  Filled 2024-03-30: qty 4, 28d supply, fill #3
  Filled 2024-04-27: qty 4, 28d supply, fill #4
  Filled 2024-05-20: qty 4, 28d supply, fill #5

## 2024-01-02 NOTE — Progress Notes (Signed)
 Pt called to request that we change delivery date to ship 6/30 for her to receive on 7/01, as she will be out of town.

## 2024-01-06 ENCOUNTER — Telehealth: Payer: Self-pay

## 2024-01-06 ENCOUNTER — Other Ambulatory Visit: Payer: Self-pay

## 2024-01-06 NOTE — Progress Notes (Signed)
 PA pending

## 2024-01-06 NOTE — Telephone Encounter (Signed)
 Pharmacy Patient Advocate Encounter   Received notification from Patient Pharmacy that prior authorization for Dupixent  is required/requested.   Insurance verification completed.   The patient is insured through [facility] .   Per test claim: PA required; PA submitted to above mentioned insurance via CoverMyMeds Key/confirmation #/EOC [id number] Status is pending

## 2024-01-06 NOTE — Progress Notes (Signed)
 Prior Auth Required. Sent to Tiffany P.

## 2024-01-07 ENCOUNTER — Other Ambulatory Visit: Payer: Self-pay

## 2024-01-07 NOTE — Telephone Encounter (Signed)
 Pharmacy Patient Advocate Encounter  Received notification from [facility] that Prior Authorization for Dupixent  has been APPROVED from 01/06/24 to 01/04/25   PA #/Case ID/Reference #: [id number]

## 2024-01-07 NOTE — Progress Notes (Signed)
 PA approved.

## 2024-01-07 NOTE — Progress Notes (Signed)
 Same day courier for today since patient will be out of town tomorrow

## 2024-01-08 ENCOUNTER — Other Ambulatory Visit: Payer: Self-pay

## 2024-01-08 NOTE — Progress Notes (Signed)
 Sending out package via regular [facility] delivery, Tracking # [id number]

## 2024-01-20 ENCOUNTER — Other Ambulatory Visit (HOSPITAL_COMMUNITY): Payer: Self-pay

## 2024-01-29 ENCOUNTER — Other Ambulatory Visit: Payer: Self-pay

## 2024-01-30 ENCOUNTER — Other Ambulatory Visit: Payer: Self-pay

## 2024-01-30 NOTE — Progress Notes (Signed)
 Specialty Pharmacy Refill Coordination Note  Lydia Davis is a 48 y.o. female contacted today regarding refills of specialty medication(s) Dupilumab  (Dupixent )   Patient requested Delivery   Delivery date: 02/05/24   Verified address: [address]  [place]   Medication will be filled on 07.29.25.

## 2024-02-04 ENCOUNTER — Other Ambulatory Visit: Payer: Self-pay

## 2024-02-07 ENCOUNTER — Encounter: Admitting: Family Medicine

## 2024-02-10 ENCOUNTER — Ambulatory Visit (INDEPENDENT_AMBULATORY_CARE_PROVIDER_SITE_OTHER): Admitting: Family Medicine

## 2024-02-10 ENCOUNTER — Encounter: Payer: Self-pay | Admitting: Family Medicine

## 2024-02-10 VITALS — BP 110/80 | HR 70 | Temp 99.1°F | Resp 16 | Ht 68.0 in | Wt 159.4 lb

## 2024-02-10 DIAGNOSIS — Z0000 Encounter for general adult medical examination without abnormal findings: Secondary | ICD-10-CM

## 2024-02-10 DIAGNOSIS — F419 Anxiety disorder, unspecified: Secondary | ICD-10-CM

## 2024-02-10 DIAGNOSIS — E785 Hyperlipidemia, unspecified: Secondary | ICD-10-CM

## 2024-02-10 DIAGNOSIS — D0359 Melanoma in situ of other part of trunk: Secondary | ICD-10-CM | POA: Diagnosis not present

## 2024-02-10 DIAGNOSIS — Z1159 Encounter for screening for other viral diseases: Secondary | ICD-10-CM

## 2024-02-10 LAB — CBC WITH DIFFERENTIAL/PLATELET
Basophils Absolute: 0 K/uL (ref 0.0–0.1)
Basophils Relative: 0.5 % (ref 0.0–3.0)
Eosinophils Absolute: 0.1 K/uL (ref 0.0–0.7)
Eosinophils Relative: 1.3 % (ref 0.0–5.0)
HCT: 42.8 % (ref 36.0–46.0)
Hemoglobin: 14.2 g/dL (ref 12.0–15.0)
Lymphocytes Relative: 28.5 % (ref 12.0–46.0)
Lymphs Abs: 1.2 K/uL (ref 0.7–4.0)
MCHC: 33.1 g/dL (ref 30.0–36.0)
MCV: 89.6 fl (ref 78.0–100.0)
Monocytes Absolute: 0.3 K/uL (ref 0.1–1.0)
Monocytes Relative: 8.1 % (ref 3.0–12.0)
Neutro Abs: 2.6 K/uL (ref 1.4–7.7)
Neutrophils Relative %: 61.6 % (ref 43.0–77.0)
Platelets: 205 K/uL (ref 150.0–400.0)
RBC: 4.77 Mil/uL (ref 3.87–5.11)
RDW: 12.7 % (ref 11.5–15.5)
WBC: 4.2 K/uL (ref 4.0–10.5)

## 2024-02-10 LAB — LIPID PANEL
Cholesterol: 218 mg/dL — ABNORMAL HIGH (ref 0–200)
HDL: 51.3 mg/dL (ref >39.00)
LDL Cholesterol: 153 mg/dL — ABNORMAL HIGH (ref 0–99)
NonHDL: 166.67
Total CHOL/HDL Ratio: 4
Triglycerides: 69 mg/dL (ref 0.0–149.0)
VLDL: 13.8 mg/dL (ref 0.0–40.0)

## 2024-02-10 LAB — COMPREHENSIVE METABOLIC PANEL WITH GFR
ALT: 6 U/L (ref 0–35)
AST: 16 U/L (ref 0–37)
Albumin: 4.4 g/dL (ref 3.5–5.2)
Alkaline Phosphatase: 65 U/L (ref 39–117)
BUN: 17 mg/dL (ref 6–23)
CO2: 30 meq/L (ref 19–32)
Calcium: 9.4 mg/dL (ref 8.4–10.5)
Chloride: 103 meq/L (ref 96–112)
Creatinine, Ser: 0.82 mg/dL (ref 0.40–1.20)
GFR: 84.76 mL/min (ref >60.00)
Glucose, Bld: 90 mg/dL (ref 70–99)
Potassium: 4.4 meq/L (ref 3.5–5.1)
Sodium: 140 meq/L (ref 135–145)
Total Bilirubin: 0.8 mg/dL (ref 0.2–1.2)
Total Protein: 7.4 g/dL (ref 6.0–8.3)

## 2024-02-10 LAB — TSH: TSH: 1.68 u[IU]/mL (ref 0.35–5.50)

## 2024-02-10 NOTE — Progress Notes (Signed)
 Subjective:    Patient ID: Lydia Davis, female    DOB: [birth year], 48 y.o.   MRN: [id number]  Chief Complaint  Patient presents with   Annual Exam    Pt states fasting     HPI Patient is in today for cpe.  Discussed the use of AI scribe software for clinical note transcription with the patient, who gave verbal consent to proceed.  History of Present Illness Lydia Davis is a 48 year old female who presents for an annual physical exam.  She is currently taking a low dose of Lexapro  for mood and stress management. She experiences low mood at times and a lack of interest in activities, but denies suicidal thoughts. She is considering herbal supplements such as ashwagandha and mushroom coffee. She also takes magnesium complex with L-theanine at night, which aids her sleep.  In March, she had a melanoma removed from her back after noticing it appeared darker and changed shape. It was excised early by a PA under Dr. Tricia.  She engages in regular exercise, including strength training and aerobic activities, noting that aerobic exercise helps her mood more than lifting weights.    Past Medical History:  Diagnosis Date   Anxiety    Eczema    History of TB skin testing    postive    Past Surgical History:  Procedure Laterality Date   CESAREAN SECTION  2012   TONSILLECTOMY  1994    Family History  Problem Relation Age of Onset   Hypertension Father        and both sets of grandparents   Psoriasis Father    Diabetes Maternal Grandfather    Hyperlipidemia Paternal Grandfather    Heart attack Paternal Grandfather    Heart disease Other        both sets of grandparents    Social History   Socioeconomic History   Marital status: Married    Spouse name: Not on file   Number of children: 1   Years of education: Not on file   Highest education level: Bachelor's degree (e.g., BA, AB, BS)  Occupational History   Occupation:  Copywriter, advertising: [employer]    Comment: works in the OR  Tobacco Use   Smoking status: Never   Smokeless tobacco: Never  Substance and Sexual Activity   Alcohol use: Yes    Alcohol/week: 0.0 standard drinks of alcohol    Comment: occasionally---glass of wine   Drug use: No   Sexual activity: Yes    Partners: Male    Birth control/protection: I.U.D.  Other Topics Concern   Not on file  Social History Narrative   Exercise--Walking, and weight s    Social Drivers of Health   Financial Resource Strain: Low Risk  (02/09/2024)   Overall Financial Resource Strain (CARDIA)    Difficulty of Paying Living Expenses: Not hard at all  Food Insecurity: No Food Insecurity (02/09/2024)   Hunger Vital Sign    Worried About Running Out of Food in the Last Year: Never true    Ran Out of Food in the Last Year: Never true  Transportation Needs: No Transportation Needs (02/09/2024)   PRAPARE - Administrator, Civil Service (Medical): No    Lack of Transportation (Non-Medical): No  Physical Activity: Insufficiently Active (02/09/2024)   Exercise Vital Sign  Days of Exercise per Week: 4 days    Minutes of Exercise per Session: 30 min  Stress: No Stress Concern Present (02/09/2024)   [employer] of Occupational Health - Occupational Stress Questionnaire    Feeling of Stress: Not at all  Social Connections: Moderately Isolated (02/09/2024)   Social Connection and Isolation Panel    Frequency of Communication with Friends and Family: More than three times a week    Frequency of Social Gatherings with Friends and Family: Not on file    Attends Religious Services: Never    Database administrator or Organizations: No    Attends Engineer, structural: Not on file    Marital Status: Married  Catering manager Violence: Not on file    Outpatient Medications Prior to Visit  Medication Sig Dispense Refill   ALPRAZolam  (XANAX ) 0.25 MG tablet Take 1 tablet (0.25 mg total) by  mouth 2 (two) times daily as needed for anxiety. 20 tablet 0   cetirizine (ZYRTEC) 10 MG tablet Take 10 mg by mouth daily.     Dupilumab  (DUPIXENT ) 300 MG/2ML SOAJ INJECT 1 PEN (300MG ) UNDER THE SKIN EVERY TWO WEEKS STARTING ON DAY 15 as DIRECTED. every 2 weeks 4 mL 5   escitalopram  (LEXAPRO ) 10 MG tablet Take 1 tablet (10 mg total) by mouth daily. 90 tablet 3   levonorgestrel (MIRENA) 20 MCG/24HR IUD 1 each by Intrauterine route once.     No facility-administered medications prior to visit.    Allergies  Allergen Reactions   Augmentin [Amoxicillin-Pot Clavulanate] Rash    Review of Systems  Constitutional:  Negative for chills, fever and malaise/fatigue.  HENT:  Negative for congestion and hearing loss.   Eyes:  Negative for discharge.  Respiratory:  Negative for cough, sputum production and shortness of breath.   Cardiovascular:  Negative for chest pain, palpitations and leg swelling.  Gastrointestinal:  Negative for abdominal pain, blood in stool, constipation, diarrhea, heartburn, nausea and vomiting.  Genitourinary:  Negative for dysuria, frequency, hematuria and urgency.  Musculoskeletal:  Negative for back pain, falls and myalgias.  Skin:  Negative for rash.  Neurological:  Negative for dizziness, sensory change, loss of consciousness, weakness and headaches.  Endo/Heme/Allergies:  Negative for environmental allergies. Does not bruise/bleed easily.  Psychiatric/Behavioral:  Negative for depression and suicidal ideas. The patient is not nervous/anxious and does not have insomnia.        Objective:    Physical Exam Vitals and nursing note reviewed.  Constitutional:      General: She is not in acute distress.    Appearance: Normal appearance. She is well-developed.  HENT:     Head: Normocephalic and atraumatic.     Right Ear: Tympanic membrane, ear canal and external ear normal. There is no impacted cerumen.     Left Ear: Tympanic membrane, ear canal and external ear  normal. There is no impacted cerumen.     Nose: Nose normal.     Mouth/Throat:     Mouth: Mucous membranes are moist.     Pharynx: Oropharynx is clear. No oropharyngeal exudate or posterior oropharyngeal erythema.  Eyes:     General: No scleral icterus.       Right eye: No discharge.        Left eye: No discharge.     Conjunctiva/sclera: Conjunctivae normal.     Pupils: Pupils are equal, round, and reactive to light.  Neck:     Thyroid : No thyromegaly or thyroid  tenderness.  Vascular: No JVD.  Cardiovascular:     Rate and Rhythm: Normal rate and regular rhythm.     Heart sounds: Normal heart sounds. No murmur heard. Pulmonary:     Effort: Pulmonary effort is normal. No respiratory distress.     Breath sounds: Normal breath sounds.  Abdominal:     General: Bowel sounds are normal. There is no distension.     Palpations: Abdomen is soft. There is no mass.     Tenderness: There is no abdominal tenderness. There is no guarding or rebound.  Musculoskeletal:        General: Normal range of motion.     Cervical back: Normal range of motion and neck supple.     Right lower leg: No edema.     Left lower leg: No edema.  Lymphadenopathy:     Cervical: No cervical adenopathy.  Skin:    General: Skin is warm and dry.     Findings: No erythema or rash.  Neurological:     Mental Status: She is alert and oriented to person, place, and time.     Cranial Nerves: No cranial nerve deficit.     Deep Tendon Reflexes: Reflexes are normal and symmetric.  Psychiatric:        Mood and Affect: Mood normal.        Behavior: Behavior normal.        Thought Content: Thought content normal.        Judgment: Judgment normal.     BP 110/80 (BP Location: Right Arm, Patient Position: Sitting, Cuff Size: Normal)   Pulse 70   Temp 99.1 F (37.3 C) (Oral)   Resp 16   Ht 5' 8 (1.727 m)   Wt 159 lb 6.4 oz (72.3 kg)   SpO2 98%   BMI 24.24 kg/m  Wt Readings from Last 3 Encounters:  02/10/24 159 lb  6.4 oz (72.3 kg)  05/31/23 156 lb 6.4 oz (70.9 kg)  02/05/23 154 lb 9.6 oz (70.1 kg)    Diabetic Foot Exam - Simple   No data filed    Lab Results  Component Value Date   WBC 4.6 02/05/2023   HGB 14.0 02/05/2023   HCT 42.9 02/05/2023   PLT 227.0 02/05/2023   GLUCOSE 85 02/05/2023   CHOL 222 (H) 02/05/2023   TRIG 53.0 02/05/2023   HDL 49.20 02/05/2023   LDLCALC 162 (H) 02/05/2023   ALT 6 02/05/2023   AST 15 02/05/2023   NA 137 02/05/2023   K 4.6 02/05/2023   CL 102 02/05/2023   CREATININE 0.85 02/05/2023   BUN 14 02/05/2023   CO2 28 02/05/2023   TSH 2.08 02/05/2023    Lab Results  Component Value Date   TSH 2.08 02/05/2023   Lab Results  Component Value Date   WBC 4.6 02/05/2023   HGB 14.0 02/05/2023   HCT 42.9 02/05/2023   MCV 90.1 02/05/2023   PLT 227.0 02/05/2023   Lab Results  Component Value Date   NA 137 02/05/2023   K 4.6 02/05/2023   CO2 28 02/05/2023   GLUCOSE 85 02/05/2023   BUN 14 02/05/2023   CREATININE 0.85 02/05/2023   BILITOT 0.9 02/05/2023   ALKPHOS 72 02/05/2023   AST 15 02/05/2023   ALT 6 02/05/2023   PROT 7.0 02/05/2023   ALBUMIN 4.2 02/05/2023   CALCIUM 9.6 02/05/2023   ANIONGAP 10 08/02/2018   GFR 81.77 02/05/2023   Lab Results  Component Value Date   CHOL 222 (  H) 02/05/2023   Lab Results  Component Value Date   HDL 49.20 02/05/2023   Lab Results  Component Value Date   LDLCALC 162 (H) 02/05/2023   Lab Results  Component Value Date   TRIG 53.0 02/05/2023   Lab Results  Component Value Date   CHOLHDL 5 02/05/2023   No results found for: HGBA1C     Assessment & Plan:  Preventative health care -     CBC with Differential/Platelet -     Comprehensive metabolic panel with GFR -     Lipid panel -     TSH  Anxiety  Hyperlipidemia, unspecified hyperlipidemia type -     Comprehensive metabolic panel with GFR -     Lipid panel  Need for hepatitis C screening test -     Hepatitis C antibody  Melanoma in  situ of back (HCC)    Jamee JONELLE Antonio Cyndee, DO

## 2024-02-11 LAB — HEPATITIS C ANTIBODY: Hepatitis C Ab: NONREACTIVE

## 2024-02-16 ENCOUNTER — Ambulatory Visit: Payer: Self-pay | Admitting: Family Medicine

## 2024-02-20 ENCOUNTER — Other Ambulatory Visit: Payer: Self-pay

## 2024-02-24 ENCOUNTER — Other Ambulatory Visit: Payer: Self-pay

## 2024-02-24 DIAGNOSIS — L2084 Intrinsic (allergic) eczema: Secondary | ICD-10-CM | POA: Diagnosis not present

## 2024-02-25 ENCOUNTER — Other Ambulatory Visit: Payer: Self-pay

## 2024-02-25 ENCOUNTER — Other Ambulatory Visit (HOSPITAL_COMMUNITY): Payer: Self-pay

## 2024-02-25 NOTE — Progress Notes (Signed)
 Specialty Pharmacy Refill Coordination Note  Spoke with Kendelle Piedad Standiford is a 48 y.o. female contacted today regarding refills of specialty medication(s) Dupilumab  (Dupixent )  Doses on hand: 0  Injection date: 03/08/24   Patient requested: Delivery   Delivery date: 03/03/24   Verified address: [address] [place] [address]  Medication will be filled on 03/02/24.

## 2024-03-02 ENCOUNTER — Other Ambulatory Visit: Payer: Self-pay

## 2024-03-23 ENCOUNTER — Other Ambulatory Visit (HOSPITAL_COMMUNITY): Payer: Self-pay

## 2024-03-26 ENCOUNTER — Other Ambulatory Visit (HOSPITAL_COMMUNITY): Payer: Self-pay

## 2024-03-29 ENCOUNTER — Encounter (INDEPENDENT_AMBULATORY_CARE_PROVIDER_SITE_OTHER): Payer: Self-pay

## 2024-03-30 ENCOUNTER — Other Ambulatory Visit (HOSPITAL_COMMUNITY): Payer: Self-pay

## 2024-03-30 ENCOUNTER — Other Ambulatory Visit: Payer: Self-pay

## 2024-03-30 NOTE — Progress Notes (Signed)
 Specialty Pharmacy Refill Coordination Note  Lydia Davis is a 48 y.o. female contacted today regarding refills of specialty medication(s) Dupilumab  (Dupixent )   Patient requested (Patient-Rptd) Delivery   Delivery date: 04/03/24   Verified address: (Patient-Rptd) [address], [place], [place] [address]   Medication will be filled on 04/02/24.

## 2024-03-31 ENCOUNTER — Other Ambulatory Visit (HOSPITAL_COMMUNITY): Payer: Self-pay

## 2024-04-01 ENCOUNTER — Other Ambulatory Visit: Payer: Self-pay

## 2024-04-23 ENCOUNTER — Other Ambulatory Visit (HOSPITAL_COMMUNITY): Payer: Self-pay

## 2024-04-27 ENCOUNTER — Other Ambulatory Visit (HOSPITAL_COMMUNITY): Payer: Self-pay

## 2024-04-27 ENCOUNTER — Encounter (INDEPENDENT_AMBULATORY_CARE_PROVIDER_SITE_OTHER): Payer: Self-pay

## 2024-04-28 ENCOUNTER — Other Ambulatory Visit: Payer: Self-pay

## 2024-04-28 NOTE — Progress Notes (Signed)
 Specialty Pharmacy Refill Coordination Note  Lydia Davis is a 48 y.o. female contacted today regarding refills of specialty medication(s) Dupilumab  (Dupixent )   Patient requested Delivery   Delivery date: 04/30/24   Verified address: [address], [place], [place] [address]   Medication will be filled on 10.22.25.

## 2024-04-30 ENCOUNTER — Other Ambulatory Visit: Payer: Self-pay

## 2024-04-30 NOTE — Progress Notes (Signed)
 Specialty Pharmacy Ongoing Clinical Assessment Note  Lydia Davis is a 48 y.o. female who is being followed by the specialty pharmacy service for RxSp Atopic Dermatitis   Patient's specialty medication(s) reviewed today: Dupilumab  (Dupixent )   Missed doses in the last 4 weeks: 0   Patient/Caregiver did not have any additional questions or concerns.   Therapeutic benefit summary: Patient is achieving benefit   Adverse events/side effects summary: Experienced adverse events/side effects (mild, tolerable pain at the injection site)   Patient's therapy is appropriate to: Continue    Goals Addressed             This Visit's Progress    Maintain optimal adherence to therapy   On track    Patient is on track. Patient will maintain adherence         Follow up: 12 months  Silvano LOISE Dolly Specialty Pharmacist

## 2024-05-20 ENCOUNTER — Other Ambulatory Visit: Payer: Self-pay

## 2024-05-21 ENCOUNTER — Encounter (INDEPENDENT_AMBULATORY_CARE_PROVIDER_SITE_OTHER): Payer: Self-pay

## 2024-05-22 ENCOUNTER — Other Ambulatory Visit (HOSPITAL_COMMUNITY): Payer: Self-pay

## 2024-05-22 ENCOUNTER — Other Ambulatory Visit: Payer: Self-pay

## 2024-05-22 NOTE — Progress Notes (Signed)
 Specialty Pharmacy Refill Coordination Note  MyChart Questionnaire Submission  Lydia Davis is a 48 y.o. female contacted today regarding refills of specialty medication(s) Dupixent .  Doses on hand: (Patient-Rptd) 0   Injection date: (Patient-Rptd) 06/04/24  Patient requested: (Patient-Rptd) Delivery   Delivery date: 05/26/24  Verified address: [address] [place] [address]  Medication will be filled on 05/25/24.

## 2024-05-25 ENCOUNTER — Other Ambulatory Visit: Payer: Self-pay

## 2024-06-02 ENCOUNTER — Other Ambulatory Visit: Payer: Self-pay

## 2024-06-16 ENCOUNTER — Other Ambulatory Visit (HOSPITAL_COMMUNITY): Payer: Self-pay

## 2024-06-19 DIAGNOSIS — D225 Melanocytic nevi of trunk: Secondary | ICD-10-CM | POA: Diagnosis not present

## 2024-06-19 DIAGNOSIS — D2270 Melanocytic nevi of unspecified lower limb, including hip: Secondary | ICD-10-CM | POA: Diagnosis not present

## 2024-06-19 DIAGNOSIS — Z85820 Personal history of malignant melanoma of skin: Secondary | ICD-10-CM | POA: Diagnosis not present

## 2024-06-22 ENCOUNTER — Other Ambulatory Visit: Payer: Self-pay | Admitting: Pharmacist

## 2024-06-22 ENCOUNTER — Other Ambulatory Visit: Payer: Self-pay

## 2024-06-22 MED ORDER — DUPIXENT 300 MG/2ML ~~LOC~~ SOAJ
SUBCUTANEOUS | 5 refills | Status: AC
  Filled 2024-06-23: qty 4, 28d supply, fill #0
  Filled 2024-07-20: qty 4, 28d supply, fill #1

## 2024-06-22 MED ORDER — DUPIXENT 300 MG/2ML ~~LOC~~ SOAJ: SUBCUTANEOUS | 5 refills | Status: DC

## 2024-06-23 ENCOUNTER — Other Ambulatory Visit: Payer: Self-pay

## 2024-06-25 ENCOUNTER — Other Ambulatory Visit: Payer: Self-pay

## 2024-06-25 NOTE — Progress Notes (Signed)
 Specialty Pharmacy Refill Coordination Note  Spoke with Lydia Davis is a 48 y.o. female contacted today regarding refills of specialty medication(s) Dupilumab  (Dupixent )  Doses on hand: 0  Next inj: 07/06/24   Patient requested: Delivery   Delivery date: 06/30/24   Verified address: [address] [place] [address]  Medication will be filled on 06/29/24

## 2024-07-20 ENCOUNTER — Other Ambulatory Visit: Payer: Self-pay

## 2024-07-21 ENCOUNTER — Encounter (INDEPENDENT_AMBULATORY_CARE_PROVIDER_SITE_OTHER): Payer: Self-pay

## 2024-07-22 ENCOUNTER — Other Ambulatory Visit: Payer: Self-pay | Admitting: Pharmacy Technician

## 2024-07-22 ENCOUNTER — Other Ambulatory Visit: Payer: Self-pay

## 2024-07-22 NOTE — Progress Notes (Signed)
 Specialty Pharmacy Refill Coordination Note  Lydia Davis is a 49 y.o. female contacted today regarding refills of specialty medication(s) Dupilumab  (Dupixent )   Patient requested Delivery   Delivery date: 07/30/24   Verified address: [address] [place] [address]   Medication will be filled on: 07/29/24

## 2024-07-24 ENCOUNTER — Telehealth: Payer: Self-pay | Admitting: Pharmacist

## 2024-07-24 NOTE — Telephone Encounter (Signed)
 Called patient to schedule an appointment for the [facility]. I was unable to reach the patient so I left a HIPAA-compliant message requesting that the patient return my call.   Herlene Fleeta Morris, PharmD, JAQUELINE, CPP Clinical Pharmacist [facility] [phone]

## 2024-07-29 ENCOUNTER — Other Ambulatory Visit: Payer: Self-pay

## 2024-08-03 ENCOUNTER — Other Ambulatory Visit: Payer: Self-pay

## 2024-08-11 ENCOUNTER — Other Ambulatory Visit (HOSPITAL_COMMUNITY): Payer: Self-pay
